# Patient Record
Sex: Female | Born: 1995 | Race: White | Hispanic: No | Marital: Single | Smoking: Never smoker
Health system: Southern US, Community
[De-identification: ages and names within clinical notes are randomized; demographics above are authoritative.]

## PROBLEM LIST (undated history)

## (undated) ENCOUNTER — Emergency Department: Admission: EM | Payer: BC Managed Care – PPO | Source: Home / Self Care

## (undated) DIAGNOSIS — F410 Panic disorder [episodic paroxysmal anxiety] without agoraphobia: Secondary | ICD-10-CM

## (undated) DIAGNOSIS — F419 Anxiety disorder, unspecified: Secondary | ICD-10-CM

## (undated) HISTORY — DX: Anxiety disorder, unspecified: F41.9

## (undated) HISTORY — PX: NO PAST SURGERIES: SHX2092

---
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
## 2017-04-22 ENCOUNTER — Other Ambulatory Visit: Payer: Self-pay

## 2017-04-22 ENCOUNTER — Ambulatory Visit
Admission: EM | Admit: 2017-04-22 | Discharge: 2017-04-22 | Disposition: A | Discharge status: Home / Self Care | Payer: BC Managed Care – PPO | Attending: Family Medicine | Admitting: Family Medicine

## 2017-04-22 ENCOUNTER — Encounter: Payer: Self-pay | Admitting: *Deleted

## 2017-04-22 DIAGNOSIS — R51 Headache: Secondary | ICD-10-CM

## 2017-04-22 DIAGNOSIS — R519 Headache, unspecified: Secondary | ICD-10-CM

## 2017-04-22 DIAGNOSIS — R040 Epistaxis: Secondary | ICD-10-CM | POA: Diagnosis not present

## 2017-04-22 LAB — CBC
HEMATOCRIT: 44.6 % (ref 35.0–47.0)
HEMOGLOBIN: 15.2 g/dL (ref 12.0–16.0)
MCH: 29.7 pg (ref 26.0–34.0)
MCHC: 34.1 g/dL (ref 32.0–36.0)
MCV: 87.2 fL (ref 80.0–100.0)
Platelets: 286 10*3/uL (ref 150–440)
RBC: 5.11 MIL/uL (ref 3.80–5.20)
RDW: 13.2 % (ref 11.5–14.5)
WBC: 9.5 10*3/uL (ref 3.6–11.0)

## 2017-04-22 NOTE — ED Triage Notes (Signed)
Patient has been having nose bleed ranging from mild to severe daily for the last 2 weeks. Patient does not report any sinu issues. Patient started having severe headache symptom this am.

## 2017-04-22 NOTE — Discharge Instructions (Signed)
Ibuprofen 800 mg three times daily as needed.  Vaseline and Saline.  Take care  Dr. Adriana Simasook

## 2017-04-22 NOTE — ED Provider Notes (Signed)
[facility]    CSN: [id number] Arrival date & time: 04/22/17  1631   History   Chief Complaint Chief Complaint  Patient presents with  . Epistaxis  . Headache   HPI  21 year old female presents with ongoing nosebleeds.  Patient reports that she has had frequent nosebleeds over the past 2 weeks.  She states it occurs once a day.  Last 5-10 minutes and then resolves with compression.  Her history of nosebleeds.  No history of allergies.  No nasal corticosteroid use.  She states she been using nasal saline regularly.  Additionally, she states that she has had headache this a.m.  Located diffusely from the frontal region to the septal region.  Has used Tylenol without resolution.  Pain is currently 8/10 in severity.  No reports of photophobia.  No other associated symptoms.  No known exacerbating or relieving factors.  No other complaints or concerns at this time.  PMH - None.  Surgical Hx - No past surgeries.  OB History    No data available     Home Medications    Prior to Admission medications   Not on File   Family History No reported family hx.  Social History Social History   Tobacco Use  . Smoking status: Never Smoker  . Smokeless tobacco: Never Used  Substance Use Topics  . Alcohol use: No    Frequency: Never  . Drug use: No   Allergies   Patient has no known allergies.   Review of Systems Review of Systems  HENT: Positive for nosebleeds.   Neurological: Positive for headaches.  All other systems reviewed and are negative.  Physical Exam Triage Vital Signs ED Triage Vitals  Enc Vitals Group     BP 04/22/17 1738 123/77     Pulse Rate 04/22/17 1738 83     Resp 04/22/17 1738 16     Temp 04/22/17 1738 98.1 F (36.7 C)     Temp Source 04/22/17 1738 Oral     SpO2 04/22/17 1738 99 %     Weight 04/22/17 1739 140 lb (63.5 kg)     Height 04/22/17 1739 5\' 4"  (1.626 m)     Head Circumference --      Peak Flow --      Pain Score 04/22/17  1739 8     Pain Loc --      Pain Edu? --      Excl. in GC? --    No data found.  Updated Vital Signs BP 123/77 (BP Location: Left Arm)   Pulse 83   Temp 98.1 F (36.7 C) (Oral)   Resp 16   Ht 5\' 4"  (1.626 m)   Wt 140 lb (63.5 kg)   LMP 04/08/2017   SpO2 99%   BMI 24.03 kg/m     Physical Exam  Constitutional: She is oriented to person, place, and time. She appears well-developed. No distress.  HENT:  Head: Normocephalic and atraumatic.  Nose: Mucosal edema present. No epistaxis.  Eyes: Conjunctivae are normal. No scleral icterus.  Cardiovascular: Normal rate and regular rhythm.  No murmur heard. Pulmonary/Chest: Effort normal and breath sounds normal. She has no wheezes. She has no rales.  Neurological: She is alert and oriented to person, place, and time.  No apparent focal deficits.  Skin: Skin is warm. No rash noted.  Psychiatric: She has a normal mood and affect. Her behavior is normal.  Nursing note and vitals reviewed.  UC Treatments / Results  Labs (all labs ordered are listed, but only abnormal results are displayed) Labs Reviewed  CBC    EKG  EKG Interpretation None       Radiology No results found.  Procedures Procedures (including critical care time)  Medications Ordered in UC Medications - No data to display   Initial Impression / Assessment and Plan / UC Course  I have reviewed the triage vital signs and the nursing notes.  Pertinent labs & imaging results that were available during my care of the patient were reviewed by me and considered in my medical decision making (see chart for details).     21 year old female presents with ongoing nosebleeds.  Also has headache.  CBC normal.  Advised continued use of saline and Vaseline.  Ibuprofen as needed for headache.  Final Clinical Impressions(s) / UC Diagnoses   Final diagnoses:  Anterior epistaxis  Acute nonintractable headache, unspecified headache type    ED Discharge Orders     None     Controlled Substance Prescriptions [place] Controlled Substance Registry consulted? Not Applicable   Tommie SamsCook, Jeryl Umholtz G, DO 04/22/17 [id number]

## 2017-09-22 ENCOUNTER — Ambulatory Visit
Admission: EM | Admit: 2017-09-22 | Discharge: 2017-09-22 | Disposition: A | Discharge status: Home / Self Care | Payer: BC Managed Care – PPO | Attending: Family Medicine | Admitting: Family Medicine

## 2017-09-22 DIAGNOSIS — J302 Other seasonal allergic rhinitis: Secondary | ICD-10-CM

## 2017-09-22 DIAGNOSIS — R062 Wheezing: Secondary | ICD-10-CM

## 2017-09-22 DIAGNOSIS — R0981 Nasal congestion: Secondary | ICD-10-CM

## 2017-09-22 MED ORDER — ALBUTEROL SULFATE HFA 108 (90 BASE) MCG/ACT IN AERS: 1.0000 | INHALATION_SPRAY | Freq: Four times a day (QID) | RESPIRATORY_TRACT | 0 refills | Status: DC | PRN

## 2017-09-22 MED ORDER — FLUTICASONE PROPIONATE 50 MCG/ACT NA SUSP: 1.0000 | Freq: Two times a day (BID) | NASAL | 0 refills | Status: DC | PRN

## 2017-09-22 MED ORDER — LORATADINE-PSEUDOEPHEDRINE ER 5-120 MG PO TB12: 1.0000 | ORAL_TABLET | Freq: Two times a day (BID) | ORAL | 0 refills | Status: DC

## 2017-09-22 NOTE — Discharge Instructions (Addendum)
Please take medications as prescribed.  You may continue with over-the-counter Robitussin.  Make sure you are drinking lots of fluids.  If any fevers, increasing wheezing or shortness of breath please return to clinic.

## 2017-09-22 NOTE — ED Provider Notes (Signed)
[facility]    CSN: [id number] Arrival date & time: 09/22/17  1050     History   Chief Complaint Chief Complaint  Patient presents with  . URI    HPI Sydney Banks is a 22 y.o. female.   Presents to the urgent care facility for evaluation of loss of voice, nasal congestion, wheezing.  Symptoms have present for 2 days.  She has been taking some Robitussin and Tylenol with minimal relief.  Patient states her cough is slightly improved but she still has a lot of nasal congestion and she cannot breathe through her nose.  She denies any fevers, sore throat, difficulty swallowing.  HPI  History reviewed. No pertinent past medical history.  There are no active problems to display for this patient.   History reviewed. No pertinent surgical history.  OB History   None      Home Medications    Prior to Admission medications   Medication Sig Start Date End Date Taking? Authorizing Provider  albuterol (PROVENTIL HFA;VENTOLIN HFA) 108 (90 Base) MCG/ACT inhaler Inhale 1-2 puffs into the lungs every 6 (six) hours as needed for wheezing or shortness of breath. 09/22/17   Evon Slack, PA-C  fluticasone (FLONASE) 50 MCG/ACT nasal spray Place 1-2 sprays into both nostrils 2 (two) times daily as needed for allergies or rhinitis. 09/22/17   Evon Slack, PA-C  loratadine-pseudoephedrine (CLARITIN-D 12 HOUR) 5-120 MG tablet Take 1 tablet by mouth 2 (two) times daily. 09/22/17   Evon Slack, PA-C    Family History No family history on file.  Social History Social History   Tobacco Use  . Smoking status: Never Smoker  . Smokeless tobacco: Never Used  Substance Use Topics  . Alcohol use: No    Frequency: Never  . Drug use: No     Allergies   Patient has no known allergies.   Review of Systems Review of Systems  Constitutional: Negative for fever.  HENT: Positive for congestion, rhinorrhea and voice change. Negative for ear discharge, sinus pressure,  sinus pain, sore throat and trouble swallowing.   Respiratory: Positive for cough and wheezing. Negative for shortness of breath and stridor.   Cardiovascular: Negative for chest pain.  Gastrointestinal: Negative for abdominal pain, diarrhea, nausea and vomiting.  Genitourinary: Negative for dysuria, flank pain and pelvic pain.  Musculoskeletal: Negative for back pain and myalgias.  Skin: Negative for rash.  Neurological: Negative for dizziness and headaches.     Physical Exam Triage Vital Signs ED Triage Vitals  Enc Vitals Group     BP 09/22/17 1059 122/82     Pulse Rate 09/22/17 1059 88     Resp 09/22/17 1059 18     Temp 09/22/17 1059 97.8 F (36.6 C)     Temp Source 09/22/17 1059 Oral     SpO2 09/22/17 1059 100 %     Weight 09/22/17 1101 140 lb (63.5 kg)     Height --      Head Circumference --      Peak Flow --      Pain Score 09/22/17 1100 4     Pain Loc --      Pain Edu? --      Excl. in GC? --    No data found.  Updated Vital Signs BP 122/82 (BP Location: Right Arm)   Pulse 88   Temp 97.8 F (36.6 C) (Oral)   Resp 18   Wt 140 lb (63.5 kg)  LMP 09/08/2017 (Exact Date)   SpO2 100%   BMI 24.03 kg/m   Visual Acuity Right Eye Distance:   Left Eye Distance:   Bilateral Distance:    Right Eye Near:   Left Eye Near:    Bilateral Near:     Physical Exam  Constitutional: She is oriented to person, place, and time. She appears well-developed and well-nourished.  HENT:  Head: Normocephalic and atraumatic.  Right Ear: External ear normal.  Left Ear: External ear normal.  Mouth/Throat: No oropharyngeal exudate.  Pharynx is normal no pharyngeal erythema or exudates.  Uvula is midline.  No sinus tenderness palpation.  Eyes: Conjunctivae are normal.  Neck: Normal range of motion.  Cardiovascular: Normal rate.  Pulmonary/Chest: Effort normal. No respiratory distress. She has wheezes (Mild).  Musculoskeletal: Normal range of motion.  Lymphadenopathy:    She  has no cervical adenopathy.  Neurological: She is alert and oriented to person, place, and time.  Skin: Skin is warm. No rash noted.  Psychiatric: She has a normal mood and affect. Her behavior is normal. Thought content normal.     UC Treatments / Results  Labs (all labs ordered are listed, but only abnormal results are displayed) Labs Reviewed - No data to display  EKG None  Radiology No results found.  Procedures Procedures (including critical care time)  Medications Ordered in UC Medications - No data to display  Initial Impression / Assessment and Plan / UC Course  I have reviewed the triage vital signs and the nursing notes.  Pertinent labs & imaging results that were available during my care of the patient were reviewed by me and considered in my medical decision making (see chart for details).     22 year old female with mild wheezing.  Vital signs are stable.  She is also had nasal congestion with hard time breathing through her nose.  She is started on antihistamine with decongestant.  She will use Flonase as prescribed.  She is also given prescription for albuterol.  She is educated on signs symptoms return to clinic for. Final Clinical Impressions(s) / UC Diagnoses   Final diagnoses:  Wheezing  Seasonal allergies  Nasal congestion     Discharge Instructions     Please take medications as prescribed.  You may continue with over-the-counter Robitussin.  Make sure you are drinking lots of fluids.  If any fevers, increasing wheezing or shortness of breath please return to clinic.   ED Prescriptions    Medication Sig Dispense Auth. Provider   albuterol (PROVENTIL HFA;VENTOLIN HFA) 108 (90 Base) MCG/ACT inhaler Inhale 1-2 puffs into the lungs every 6 (six) hours as needed for wheezing or shortness of breath. 1 Inhaler Evon Slack, PA-C   loratadine-pseudoephedrine (CLARITIN-D 12 HOUR) 5-120 MG tablet Take 1 tablet by mouth 2 (two) times daily. 20 tablet  Evon Slack, PA-C   fluticasone (FLONASE) 50 MCG/ACT nasal spray Place 1-2 sprays into both nostrils 2 (two) times daily as needed for allergies or rhinitis. 16 g Ronnette Juniper       Evon Slack, [place] 09/22/17 1132

## 2017-09-22 NOTE — ED Triage Notes (Signed)
Pt thinks she has an URI. Loss of voice, chest tightness, nasal congestion and sore throat from coughing since Thursday. Did take otc meds without relief.

## 2017-09-23 ENCOUNTER — Encounter: Payer: Self-pay | Admitting: Emergency Medicine

## 2017-09-23 ENCOUNTER — Emergency Department
Admission: EM | Admit: 2017-09-23 | Discharge: 2017-09-23 | Disposition: A | Discharge status: Home / Self Care | Payer: BC Managed Care – PPO | Attending: Emergency Medicine | Admitting: Emergency Medicine

## 2017-09-23 ENCOUNTER — Emergency Department: Payer: BC Managed Care – PPO

## 2017-09-23 ENCOUNTER — Other Ambulatory Visit: Payer: Self-pay

## 2017-09-23 DIAGNOSIS — R091 Pleurisy: Secondary | ICD-10-CM | POA: Insufficient documentation

## 2017-09-23 DIAGNOSIS — R05 Cough: Secondary | ICD-10-CM | POA: Diagnosis present

## 2017-09-23 MED ORDER — BENZONATATE 100 MG PO CAPS
200.0000 mg | ORAL_CAPSULE | Freq: Once | ORAL | Status: AC
  Administered 2017-09-23: 200 mg via ORAL
  Filled 2017-09-23: qty 2

## 2017-09-23 MED ORDER — BENZONATATE 100 MG PO CAPS: 100.0000 mg | ORAL_CAPSULE | Freq: Three times a day (TID) | ORAL | 0 refills | Status: AC | PRN

## 2017-09-23 MED ORDER — PREDNISONE 50 MG PO TABS: ORAL_TABLET | ORAL | 0 refills | Status: DC

## 2017-09-23 NOTE — ED Triage Notes (Signed)
Patient ambulatory to triage with steady gait, without difficulty or distress noted; st rx albuterol, flonase and claritin yesterday for URI; c/o persistent nonprod cough and congestion

## 2017-09-23 NOTE — ED Provider Notes (Signed)
[facility] Emergency Department Provider Note  ____________________________________________  Time seen: Approximately 10:11 PM  I have reviewed the triage vital signs and the nursing notes.   HISTORY  Chief Complaint Cough and Nasal Congestion    HPI Sydney Banks is a 22 y.o. female presents to the emergency department with worsening nonproductive cough and a sense of being unable to take a deep breath.  Patient reports that she feels short of breath due to not being able to take a deep breath.  Patient reports pain with deep inspiration.  She denies fever or purulent sputum production.  No fatigue.  Patient was previously seen by med in urgent care and discharged with albuterol and Flonase.  Patient reports that her symptoms are not improving.   History reviewed. No pertinent past medical history.  There are no active problems to display for this patient.   History reviewed. No pertinent surgical history.  Prior to Admission medications   Medication Sig Start Date End Date Taking? Authorizing Provider  albuterol (PROVENTIL HFA;VENTOLIN HFA) 108 (90 Base) MCG/ACT inhaler Inhale 1-2 puffs into the lungs every 6 (six) hours as needed for wheezing or shortness of breath. 09/22/17   Evon Slack, PA-C  benzonatate (TESSALON PERLES) 100 MG capsule Take 1 capsule (100 mg total) by mouth 3 (three) times daily as needed for up to 7 days for cough. 09/23/17 09/30/17  Orvil Feil, PA-C  fluticasone (FLONASE) 50 MCG/ACT nasal spray Place 1-2 sprays into both nostrils 2 (two) times daily as needed for allergies or rhinitis. 09/22/17   Evon Slack, PA-C  loratadine-pseudoephedrine (CLARITIN-D 12 HOUR) 5-120 MG tablet Take 1 tablet by mouth 2 (two) times daily. 09/22/17   Evon Slack, PA-C  predniSONE (DELTASONE) 50 MG tablet Take one 50 mg tablet once daily for the next 5 days. 09/23/17   Orvil Feil, PA-C    Allergies Patient has no known  allergies.  No family history on file.  Social History Social History   Tobacco Use  . Smoking status: Never Smoker  . Smokeless tobacco: Never Used  Substance Use Topics  . Alcohol use: No    Frequency: Never  . Drug use: No     Review of Systems  Constitutional: No fever/chills Eyes: No visual changes. No discharge ENT: Patient has congestion. Cardiovascular: no chest pain. Respiratory: Patient has pleuritic chest pain. Gastrointestinal: No abdominal pain.  No nausea, no vomiting.  No diarrhea.  No constipation. Musculoskeletal: Negative for musculoskeletal pain. Skin: Negative for rash, abrasions, lacerations, ecchymosis. Neurological: Negative for headaches, focal weakness or numbness.   ____________________________________________   PHYSICAL EXAM:  VITAL SIGNS: ED Triage Vitals  Enc Vitals Group     BP 09/23/17 2056 130/80     Pulse Rate 09/23/17 2056 94     Resp 09/23/17 2056 18     Temp 09/23/17 2056 98.3 F (36.8 C)     Temp Source 09/23/17 2056 Oral     SpO2 09/23/17 2056 99 %     Weight 09/23/17 2058 140 lb (63.5 kg)     Height 09/23/17 2058  (1.626 m)     Head Circumference --      Peak Flow --      Pain Score 09/23/17 2058 0     Pain Loc --      Pain Edu? --      Excl. in GC? --      Constitutional: Alert and oriented. Well appearing  and in no acute distress. Eyes: Conjunctivae are normal. PERRL. EOMI. Head: Atraumatic. ENT:      Ears: TMs are effused bilaterally.      Nose: No congestion/rhinnorhea.      Mouth/Throat: Mucous membranes are moist.  Neck: No stridor.  No cervical spine tenderness to palpation. Cardiovascular: Normal rate, regular rhythm. Normal S1 and S2.  Good peripheral circulation. Respiratory: Normal respiratory effort without tachypnea or retractions. Lungs CTAB. Good air entry to the bases with no decreased or absent breath sounds. Gastrointestinal: Bowel sounds 4 quadrants. Soft and nontender to palpation. No  guarding or rigidity. No palpable masses. No distention. No CVA tenderness. Musculoskeletal: Full range of motion to all extremities. No gross deformities appreciated. Neurologic:  Normal speech and language. No gross focal neurologic deficits are appreciated.  Skin:  Skin is warm, dry and intact. No rash noted.   ____________________________________________   LABS (all labs ordered are listed, but only abnormal results are displayed)  Labs Reviewed - No data to display ____________________________________________  EKG   ____________________________________________  RADIOLOGY Geraldo Pitter, personally viewed and evaluated these images (plain radiographs) as part of my medical decision making, as well as reviewing the written report by the radiologist.    Dg Chest 2 View  Result Date: 09/23/2017 CLINICAL DATA:  Initial evaluation for nonproductive cough for 1 week. EXAM: CHEST - 2 VIEW COMPARISON:  None. FINDINGS: The cardiac and mediastinal silhouettes are within normal limits. The lungs are normally inflated. No airspace consolidation, pleural effusion, or pulmonary edema is identified. There is no pneumothorax. No acute osseous abnormality identified. IMPRESSION: No active cardiopulmonary disease. Electronically Signed   By: Rise Mu M.D.   On: 09/23/2017 22:31    ____________________________________________    PROCEDURES  Procedure(s) performed:    Procedures    Medications  benzonatate (TESSALON) capsule 200 mg (200 mg Oral Given 09/23/17 2251)     ____________________________________________   INITIAL IMPRESSION / ASSESSMENT AND PLAN / ED COURSE  Pertinent labs & imaging results that were available during my care of the patient were reviewed by me and considered in my medical decision making (see chart for details).  Review of the [place] CSRS was performed in accordance of the [facility] prior to dispensing any controlled drugs.    Assessment and  plan Viral URI Pleuritis Patient presents to the emergency department with pain with deep inspiration.  Differential diagnosis included pleuritis, community-acquired pneumonia and viral URI.  Chest x-ray revealed no consolidations or findings consistent with pneumonia.  Pleuritis is likely at this time.  Patient was treated empirically with prednisone and Tessalon Perles.  A work note was provided.  Vital signs are reassuring prior to discharge.  All patient questions were answered.   ____________________________________________  FINAL CLINICAL IMPRESSION(S) / ED DIAGNOSES  Final diagnoses:  Pleuritis      NEW MEDICATIONS STARTED DURING THIS VISIT:  ED Discharge Orders        Ordered    predniSONE (DELTASONE) 50 MG tablet     09/23/17 2243    benzonatate (TESSALON PERLES) 100 MG capsule  3 times daily PRN     09/23/17 2244          This chart was dictated using voice recognition software/Dragon. Despite best efforts to proofread, errors can occur which can change the meaning. Any change was purely unintentional.    Orvil Feil, PA-C 09/23/17 2258    Minna Antis, MD 09/23/17 [phone]

## 2017-09-23 NOTE — ED Notes (Signed)
Patient denies pain and is resting comfortably.  

## 2018-02-06 ENCOUNTER — Emergency Department
Admission: EM | Admit: 2018-02-06 | Discharge: 2018-02-07 | Disposition: A | Discharge status: Home / Self Care | Payer: BC Managed Care – PPO | Attending: Emergency Medicine | Admitting: Emergency Medicine

## 2018-02-06 ENCOUNTER — Other Ambulatory Visit: Payer: Self-pay

## 2018-02-06 DIAGNOSIS — T7411XA Adult physical abuse, confirmed, initial encounter: Secondary | ICD-10-CM

## 2018-02-06 DIAGNOSIS — Z91411 Personal history of adult psychological abuse: Secondary | ICD-10-CM | POA: Insufficient documentation

## 2018-02-06 DIAGNOSIS — Z0471 Encounter for examination and observation following alleged adult physical abuse: Secondary | ICD-10-CM | POA: Insufficient documentation

## 2018-02-06 DIAGNOSIS — T7431XA Adult psychological abuse, confirmed, initial encounter: Secondary | ICD-10-CM

## 2018-02-06 DIAGNOSIS — F410 Panic disorder [episodic paroxysmal anxiety] without agoraphobia: Secondary | ICD-10-CM

## 2018-02-06 DIAGNOSIS — F419 Anxiety disorder, unspecified: Secondary | ICD-10-CM | POA: Insufficient documentation

## 2018-02-06 DIAGNOSIS — Z0441 Encounter for examination and observation following alleged adult rape: Secondary | ICD-10-CM | POA: Insufficient documentation

## 2018-02-06 DIAGNOSIS — T7421XA Adult sexual abuse, confirmed, initial encounter: Secondary | ICD-10-CM

## 2018-02-06 MED ORDER — LORAZEPAM 1 MG PO TABS
1.0000 mg | ORAL_TABLET | Freq: Once | ORAL | Status: AC
  Administered 2018-02-06: 1 mg via ORAL
  Filled 2018-02-06: qty 1
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
## 2018-02-06 NOTE — ED Notes (Signed)
[facility] personnel at bedside 
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
## 2018-02-07 MED ORDER — LORAZEPAM 1 MG PO TABS
1.0000 mg | ORAL_TABLET | Freq: Three times a day (TID) | ORAL | 0 refills | Status: AC | PRN
Start: 2018-02-07 — End: 2019-02-07
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
## 2018-02-07 NOTE — ED Notes (Signed)
Sane RN left patient's room. Patient up to toilet.

## 2018-02-07 NOTE — ED Notes (Signed)
Reviewed discharge instructions, follow-up care, and prescriptions with patient. Patient verbalized understanding of all information reviewed. Patient stable, with no distress noted at this time.    

## 2018-02-07 NOTE — ED Notes (Signed)
ED Provider at bedside. 
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
## 2018-06-20 ENCOUNTER — Encounter: Payer: Self-pay | Admitting: Emergency Medicine

## 2018-06-20 ENCOUNTER — Emergency Department
Admission: EM | Admit: 2018-06-20 | Discharge: 2018-06-20 | Disposition: A | Discharge status: Home / Self Care | Payer: BC Managed Care – PPO | Attending: Emergency Medicine | Admitting: Emergency Medicine

## 2018-06-20 ENCOUNTER — Emergency Department: Payer: BC Managed Care – PPO

## 2018-06-20 ENCOUNTER — Other Ambulatory Visit: Payer: Self-pay

## 2018-06-20 DIAGNOSIS — Z79899 Other long term (current) drug therapy: Secondary | ICD-10-CM | POA: Diagnosis not present

## 2018-06-20 DIAGNOSIS — K6289 Other specified diseases of anus and rectum: Secondary | ICD-10-CM | POA: Insufficient documentation

## 2018-06-20 DIAGNOSIS — Z139 Encounter for screening, unspecified: Secondary | ICD-10-CM | POA: Diagnosis not present

## 2018-06-20 HISTORY — DX: Panic disorder (episodic paroxysmal anxiety): F41.0

## 2018-06-20 LAB — URINALYSIS, ROUTINE W REFLEX MICROSCOPIC
Bilirubin Urine: NEGATIVE
Glucose, UA: NEGATIVE mg/dL
HGB URINE DIPSTICK: NEGATIVE
KETONES UR: 20 mg/dL — AB
LEUKOCYTE UA: NEGATIVE
Nitrite: NEGATIVE
PROTEIN: NEGATIVE mg/dL
Specific Gravity, Urine: 1.02 (ref 1.005–1.030)
pH: 6 (ref 5.0–8.0)

## 2018-06-20 LAB — PREGNANCY, URINE: PREG TEST UR: NEGATIVE

## 2018-06-20 NOTE — Discharge Instructions (Signed)
Follow-up closely with [facility] department.  Additionally, please follow-up closely with 1 of our OB/GYN providers.   Please return to the emergency room right away if you are to develop a fever, severe nausea, your pain becomes severe or worsens, you are unable to keep food down, begin vomiting any dark or bloody fluid, you develop any dark or bloody stools, feel dehydrated, or other new concerns or symptoms arise.
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
## 2018-06-20 NOTE — ED Notes (Signed)
RN in room, RN explained to pt that MD was getting ready to discharge her but he wanted to know if she would be agreeable to either MD or RN doing an abdominal exam to ensure that she does not have tenderness. Pt was agreeable to this and requested that RN do the exam.  This RN palpated all quadrants of pts abdomen. No abdominal tenderness noted on palpation. Pt denies painful urination and states that she is still able to urinate. Pt states that she has to urinate at this time. RN asked pt if she would be agreeable to giving [place] a sample. Pt asked "can I just go home". RN informed pt that she did not have to stay and wait on the results of the urine. Pt was agreeable to providing a specimen. Pt was instructed on how to perform a clean catch urine sample.
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
## 2018-06-20 NOTE — ED Notes (Signed)
RN in room to inform pt that SANE nurse is here to speak with her. Pt is still agreeable to speaking with SANE RN.  Pt Friend, Cassie at bedside

## 2018-06-20 NOTE — ED Notes (Signed)
Pt tolerated x-ray however was very anxious and had visible tremors during the exam. Pt denies pain at this time. Pt decline anything for anxiety at this time. Pt is asking that her friend, Cassie come back in the room.
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
## 2018-06-20 NOTE — ED Notes (Addendum)
SANE nurse out of room, pt requesting that her friend, Wilford Sports, come back in room.  SANE RN to speak with Dr. Fanny Bien

## 2018-06-20 NOTE — ED Notes (Signed)
RN in room, explained to the pt that x-ray is here to do her abdominal x-ray. Pt asked if her friend, Wilford Sports could be present during the x-ray. This RN informed pt that Cassie could not stay in the room during the exam but that I would put on lead apron and stay in the room with her during the exam if this would make her feel more comfortable. Pt was agreeable to this and x-rays techs were informed of plan.

## 2018-06-20 NOTE — ED Notes (Signed)
RN in room to check on pt. Pt sitting on bed with her shoes on. Pt requesting that Cassie come back in the room. Informed her that I would find Cassie and have her come back in the room.

## 2018-06-20 NOTE — ED Notes (Signed)
SANE nurse Jasmine December in room speaking with pt
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
## 2019-04-30 ENCOUNTER — Ambulatory Visit: Payer: BC Managed Care – PPO | Attending: Internal Medicine

## 2019-04-30 DIAGNOSIS — Z20822 Contact with and (suspected) exposure to covid-19: Secondary | ICD-10-CM

## 2019-05-01 LAB — NOVEL CORONAVIRUS, NAA: SARS-CoV-2, NAA: NOT DETECTED

## 2019-05-11 ENCOUNTER — Ambulatory Visit: Payer: BC Managed Care – PPO | Attending: Internal Medicine

## 2019-05-11 DIAGNOSIS — Z20822 Contact with and (suspected) exposure to covid-19: Secondary | ICD-10-CM

## 2019-05-12 LAB — NOVEL CORONAVIRUS, NAA: SARS-CoV-2, NAA: NOT DETECTED

## 2019-05-19 ENCOUNTER — Telehealth: Payer: Self-pay

## 2019-05-19 NOTE — Telephone Encounter (Signed)
Pt called to say that she has missed four calls from [facility]. She took a Covid test and go the results from her Mychart. I explained that they results were negative and she is probably getting the automated call to inform her of the results. She is requesting to have them stopped. I gave her the MyChart help desk phone number for support.   Sydney Banks

## 2019-07-10 ENCOUNTER — Ambulatory Visit: Payer: BC Managed Care – PPO | Attending: Internal Medicine

## 2019-07-10 DIAGNOSIS — Z20822 Contact with and (suspected) exposure to covid-19: Secondary | ICD-10-CM

## 2019-07-11 LAB — NOVEL CORONAVIRUS, NAA: SARS-CoV-2, NAA: NOT DETECTED

## 2019-12-26 ENCOUNTER — Emergency Department
Admission: EM | Admit: 2019-12-26 | Discharge: 2019-12-26 | Discharge status: Against Medical Advice | Payer: BC Managed Care – PPO

## 2019-12-26 NOTE — ED Notes (Signed)
Pt left without being seen at 1132.

## 2020-01-06 ENCOUNTER — Other Ambulatory Visit: Payer: Self-pay

## 2020-02-28 ENCOUNTER — Other Ambulatory Visit: Payer: Self-pay

## 2020-02-28 ENCOUNTER — Encounter: Payer: Self-pay | Admitting: Emergency Medicine

## 2020-02-28 ENCOUNTER — Ambulatory Visit
Admission: EM | Admit: 2020-02-28 | Discharge: 2020-02-28 | Disposition: A | Discharge status: Home / Self Care | Payer: BC Managed Care – PPO | Attending: Emergency Medicine | Admitting: Emergency Medicine

## 2020-02-28 DIAGNOSIS — H60501 Unspecified acute noninfective otitis externa, right ear: Secondary | ICD-10-CM

## 2020-02-28 MED ORDER — NEOMYCIN-POLYMYXIN-HC 3.5-10000-1 OT SUSP: 4.0000 [drp] | Freq: Three times a day (TID) | OTIC | 0 refills | Status: DC

## 2020-02-28 NOTE — ED Triage Notes (Signed)
Patient c/o bilateral ear pain and fullness that started yesterday.  Patient denies fevers.

## 2020-02-28 NOTE — ED Provider Notes (Signed)
[facility]    CSN: [id number] Arrival date & time: 02/28/20  1325      History   Chief Complaint Chief Complaint  Patient presents with  . Otalgia    HPI Sydney Banks is a 24 y.o. female.   24 year old female presents for evaluation of bilateral ear pain right worse than left.  Patient reports that her pain started last night.  She says it felt like when she is getting swimmer's ear in the past, use some over-the-counter swimmer's ear drops and no relief.  She denies discharge, fever, changes in hearing, runny nose, or sore throat.  She has had a ringing in her right ear intermittently.  She denies any recent swimming in pools, lakes, or hot tubs.  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  She denies wearing ear buds on a regular basis.     Past Medical History:  Diagnosis Date  . Panic attacks     There are no problems to display for this patient.   History reviewed. No pertinent surgical history.  OB History   No obstetric history on file.      Home Medications    Prior to Admission medications   Medication Sig Start Date End Date Taking? Authorizing Provider  neomycin-polymyxin-hydrocortisone (CORTISPORIN) 3.5-10000-1 OTIC suspension Place 4 drops into the right ear 3 (three) times daily. 02/28/20   Becky Augusta, NP  albuterol (PROVENTIL HFA;VENTOLIN HFA) 108 (90 Base) MCG/ACT inhaler Inhale 1-2 puffs into the lungs every 6 (six) hours as needed for wheezing or shortness of breath. 09/22/17 02/28/20  Evon Slack, PA-C  fluticasone (FLONASE) 50 MCG/ACT nasal spray Place 1-2 sprays into both nostrils 2 (two) times daily as needed for allergies or rhinitis. 09/22/17 02/28/20  Evon Slack, PA-C    Family History History reviewed. No pertinent family history.  Social History Social History   Tobacco Use  . Smoking status: Never Smoker  . Smokeless tobacco: Never Used  Vaping Use  . Vaping Use: Never used  Substance Use Topics  .  Alcohol use: No  . Drug use: No     Allergies   Patient has no known allergies.   Review of Systems Review of Systems  Constitutional: Negative for activity change, appetite change and fever.  HENT: Positive for ear pain. Negative for congestion, ear discharge, rhinorrhea and sore throat.   Respiratory: Negative for cough and shortness of breath.   Cardiovascular: Negative for chest pain.  Gastrointestinal: Negative for nausea.  Musculoskeletal: Negative for arthralgias and myalgias.  Skin: Negative for rash.  Neurological: Negative for dizziness and headaches.  Hematological: Negative.   Psychiatric/Behavioral: Negative.      Physical Exam Triage Vital Signs ED Triage Vitals  Enc Vitals Group     BP 02/28/20 1340 121/74     Pulse Rate 02/28/20 1340 71     Resp 02/28/20 1340 14     Temp 02/28/20 1340 98 F (36.7 C)     Temp Source 02/28/20 1340 Oral     SpO2 02/28/20 1340 100 %     Weight 02/28/20 1337 140 lb (63.5 kg)     Height 02/28/20 1337 5\' 4"  (1.626 m)     Head Circumference --      Peak Flow --      Pain Score 02/28/20 1336 7     Pain Loc --      Pain Edu? --      Excl. in GC? --  No data found.  Updated Vital Signs BP 121/74 (BP Location: Right Arm)   Pulse 71   Temp 98 F (36.7 C) (Oral)   Resp 14   Ht 5\' 4"  (1.626 m)   Wt 140 lb (63.5 kg)   LMP 02/14/2020 (Approximate)   SpO2 100%   BMI 24.03 kg/m   Visual Acuity Right Eye Distance:   Left Eye Distance:   Bilateral Distance:    Right Eye Near:   Left Eye Near:    Bilateral Near:     Physical Exam Vitals and nursing note reviewed.  Constitutional:      General: She is not in acute distress.    Appearance: Normal appearance. She is normal weight. She is not toxic-appearing.  HENT:     Head: Normocephalic and atraumatic.     Right Ear: Tympanic membrane and external ear normal. There is no impacted cerumen.     Left Ear: Tympanic membrane, ear canal and external ear normal. There  is no impacted cerumen.     Ears:     Comments: There is mild redness and swelling of the right EAC.  There is pain with movement of the auricle but no tragal tenderness.  Left ear and TM are completely normal. Eyes:     General: No scleral icterus.    Extraocular Movements: Extraocular movements intact.     Conjunctiva/sclera: Conjunctivae normal.     Pupils: Pupils are equal, round, and reactive to light.  Cardiovascular:     Rate and Rhythm: Normal rate and regular rhythm.     Pulses: Normal pulses.     Heart sounds: Normal heart sounds. No murmur heard.  No gallop.   Pulmonary:     Effort: Pulmonary effort is normal.     Breath sounds: Normal breath sounds. No wheezing, rhonchi or rales.  Musculoskeletal:        General: No swelling or tenderness. Normal range of motion.     Cervical back: Normal range of motion and neck supple.  Lymphadenopathy:     Cervical: No cervical adenopathy.  Skin:    General: Skin is warm and dry.     Capillary Refill: Capillary refill takes less than 2 seconds.     Coloration: Skin is not jaundiced.     Findings: No erythema.  Neurological:     General: No focal deficit present.     Mental Status: She is alert and oriented to person, place, and time.  Psychiatric:        Mood and Affect: Mood normal.        Behavior: Behavior normal.        Thought Content: Thought content normal.      UC Treatments / Results  Labs (all labs ordered are listed, but only abnormal results are displayed) Labs Reviewed - No data to display  EKG   Radiology No results found.  Procedures Procedures (including critical care time)  Medications Ordered in UC Medications - No data to display  Initial Impression / Assessment and Plan / UC Course  I have reviewed the triage vital signs and the nursing notes.  Pertinent labs & imaging results that were available during my care of the patient were reviewed by me and considered in my medical decision making  (see chart for details).   Patient has had bilateral ear pain right greater than left x1 day.  No discharge, fever, or URI complaints.  Patient has a history of swimmer's ear.  She denies any  recent swimming in lakes, pools, or being in hot tubs.  On exam the right EAC is mildly erythematous and edematous.  Will treat with Cortisporin otic and have patient follow-up with PCP if not better.   Final Clinical Impressions(s) / UC Diagnoses   Final diagnoses:  Acute otitis externa of right ear, unspecified type     Discharge Instructions     Use the Cortisporin otic 3 times a day for the next 5 days.  You can take over-the-counter ibuprofen as needed for pain.  Sometimes a heating pad or hot water bottle underneath her pillow case at nighttime can provide comfort as well.  If you develop any increase in pain, drainage from your ear, or other concerning symptoms return for reevaluation or follow-up with your primary care provider.    ED Prescriptions    Medication Sig Dispense Auth. Provider   neomycin-polymyxin-hydrocortisone (CORTISPORIN) 3.5-10000-1 OTIC suspension Place 4 drops into the right ear 3 (three) times daily. 10 mL Becky Augusta, NP     PDMP not reviewed this encounter.   Becky Augusta, NP 02/28/20 [phone]

## 2020-02-28 NOTE — Discharge Instructions (Addendum)
Use the Cortisporin otic 3 times a day for the next 5 days.  You can take over-the-counter ibuprofen as needed for pain.  Sometimes a heating pad or hot water bottle underneath her pillow case at nighttime can provide comfort as well.  If you develop any increase in pain, drainage from your ear, or other concerning symptoms return for reevaluation or follow-up with your primary care provider.

## 2020-04-02 ENCOUNTER — Other Ambulatory Visit: Payer: Self-pay

## 2020-04-02 ENCOUNTER — Ambulatory Visit
Admission: EM | Admit: 2020-04-02 | Discharge: 2020-04-02 | Disposition: A | Discharge status: Home / Self Care | Payer: BC Managed Care – PPO | Attending: Emergency Medicine | Admitting: Emergency Medicine

## 2020-04-02 ENCOUNTER — Encounter: Payer: Self-pay | Admitting: Emergency Medicine

## 2020-04-02 DIAGNOSIS — A048 Other specified bacterial intestinal infections: Secondary | ICD-10-CM | POA: Diagnosis present

## 2020-04-02 DIAGNOSIS — R1013 Epigastric pain: Secondary | ICD-10-CM | POA: Insufficient documentation

## 2020-04-02 LAB — BASIC METABOLIC PANEL
Anion gap: 11 (ref 5–15)
BUN: 10 mg/dL (ref 6–20)
CO2: 26 mmol/L (ref 22–32)
Calcium: 9.6 mg/dL (ref 8.9–10.3)
Chloride: 103 mmol/L (ref 98–111)
Creatinine, Ser: 0.69 mg/dL (ref 0.44–1.00)
GFR, Estimated: >60 mL/min (ref >60)
Glucose, Bld: 93 mg/dL (ref 70–99)
Potassium: 4.2 mmol/L (ref 3.5–5.1)
Sodium: 140 mmol/L (ref 135–145)

## 2020-04-02 MED ORDER — PANTOPRAZOLE SODIUM 40 MG PO TBEC: 40.0000 mg | DELAYED_RELEASE_TABLET | Freq: Two times a day (BID) | ORAL | 0 refills | Status: DC

## 2020-04-02 NOTE — ED Provider Notes (Signed)
[facility] [place]   Name: Sydney Banks DOB: [birth year] MRN: [id number] CSN: [id number] PCP: Patient, No Pcp Per  Arrival date and time:  04/02/20 1317  Chief Complaint:  Abdominal Pain   NOTE: Prior to seeing the patient today, I have reviewed the triage nursing documentation and vital signs. Clinical staff has updated patient's PMH/PSHx, current medication list, and drug allergies/intolerances to ensure comprehensive history available to assist in medical decision making.   History:   HPI: Sydney Banks is a 24 y.o. female who presents today with complaints of digestive issues.  Patient states over the past week, every minute she has "goes right through her".  Her bowel movements over the past week have been loose.  She denies any pain or discomfort with these events, but she is unable to eat a full meal due to early satiety.  She did have nausea and her symptoms initially began approximately 6 days ago, but she took over-the-counter antinausea medication and her nausea subsided.  She is also noticed "fluttering" to her chest when she tries to eat.  These palpitations also occur when she is lying down.  The feeling tends to self resolve, however this morning it took approximately 1.5 hours for this feeling to resolve.  She denies any previous GI issues and is not taking any GI medications.  She denies any changes to her diet or any increased life stressors.   Past Medical History:  Diagnosis Date  . Panic attacks     History reviewed. No pertinent surgical history.  History reviewed. No pertinent family history.  Social History   Tobacco Use  . Smoking status: Never Smoker  . Smokeless tobacco: Never Used  Vaping Use  . Vaping Use: Never used  Substance Use Topics  . Alcohol use: No  . Drug use: No    There are no problems to display for this patient.   Home Medications:    No outpatient medications have been marked as taking for the 04/02/20  encounter [facility] Encounter).    Allergies:   Patient has no known allergies.  Review of Systems (ROS): Review of Systems  Constitutional: Negative for fatigue and fever.  Respiratory: Negative for cough and shortness of breath.   Cardiovascular: Positive for palpitations.  Gastrointestinal: Positive for diarrhea and nausea. Negative for abdominal distention, abdominal pain, anal bleeding, constipation and rectal pain.  Neurological: Negative for dizziness and headaches.  All other systems reviewed and are negative.    Vital Signs: Today's Vitals   04/02/20 1328 04/02/20 1330 04/02/20 1413  BP:  127/73   Pulse:  70   Resp:  14   Temp:  98.2 F (36.8 C)   TempSrc:  Oral   SpO2:  100%   Weight: 150 lb (68 kg)    Height: 5\' 4"  (1.626 m)    PainSc: 0-No pain  0-No pain    Physical Exam: Physical Exam Vitals and nursing note reviewed.  Constitutional:      Appearance: She is well-developed and normal weight.  HENT:     Mouth/Throat:     Mouth: Mucous membranes are moist.  Eyes:     Extraocular Movements: Extraocular movements intact.  Cardiovascular:     Rate and Rhythm: Normal rate and regular rhythm.     Heart sounds: Normal heart sounds.  Pulmonary:     Effort: Pulmonary effort is normal.     Breath sounds: Normal breath sounds.  Abdominal:  General: Abdomen is flat. Bowel sounds are normal.     Palpations: Abdomen is soft.     Tenderness: There is no abdominal tenderness.     Hernia: No hernia is present.  Skin:    General: Skin is warm and dry.     Capillary Refill: Capillary refill takes less than 2 seconds.  Neurological:     Mental Status: She is alert.      Urgent Care Treatments / Results:   LABS: PLEASE NOTE: all labs that were ordered this encounter are listed, however only abnormal results are displayed. Labs Reviewed  BASIC METABOLIC PANEL  H PYLORI, IGM, IGG, IGA AB    EKG: -None  RADIOLOGY: No results  found.  PROCEDURES: Procedures  MEDICATIONS RECEIVED THIS VISIT: Medications - No data to display  PERTINENT CLINICAL COURSE NOTES/UPDATES:   Initial Impression / Assessment and Plan / Urgent Care Course:  Pertinent labs & imaging results that were available during my care of the patient were personally reviewed by me and considered in my medical decision making (see lab/imaging section of note for values and interpretations).  Sydney Banks is a 24 y.o. female who presents to [facility] today with complaints of dyspepsia, diagnosed with dyspepsia and possible H. pylori, and treated as such with the medications below. NP and patient reviewed discharge instructions below during visit.   Patient is well appearing overall in clinic today. She does not appear to be in any acute distress. Presenting symptoms (see HPI) and exam as documented above.   I have reviewed the follow up and strict return precautions for any new or worsening symptoms. Patient is aware of symptoms that would be deemed urgent/emergent, and would thus require further evaluation either here or in the emergency department. At the time of discharge, she verbalized understanding and consent with the discharge plan as it was reviewed with her. All questions were fielded by provider and/or clinic staff prior to patient discharge.    Final Clinical Impressions / Urgent Care Diagnoses:   Final diagnoses:  Dyspepsia  H. pylori infection    New Prescriptions:  [place] Controlled Substance Registry consulted? Not Applicable  Meds ordered this encounter  Medications  . pantoprazole (PROTONIX) 40 MG tablet    Sig: Take 1 tablet (40 mg total) by mouth 2 (two) times daily.    Dispense:  21 tablet    Refill:  0      Discharge Instructions     You were seen for abnormal digestive patterns and are being treated for possible H. pylori infection.   Take your prescribed medications as directed.  I will give you a call with  your H. pylori results once they have completed.  In the meantime, start to search for a primary care provider.  Take care, Dr. Sharlet Salina, NP-c     Recommended Follow up Care:  Patient encouraged to follow up with the following provider within the specified time frame, or sooner as dictated by the severity of her symptoms. As always, she was instructed that for any urgent/emergent care needs, she should seek care either here or in the emergency department for more immediate evaluation.   Bailey Mech, DNP, NP-c    Bailey Mech, NP 04/02/20 1736

## 2020-04-02 NOTE — Discharge Instructions (Signed)
You were seen for abnormal digestive patterns and are being treated for possible H. pylori infection.   Take your prescribed medications as directed.  I will give you a call with your H. pylori results once they have completed.  In the meantime, start to search for a primary care provider.  Take care, Dr. Sharlet Salina, NP-c

## 2020-04-02 NOTE — ED Triage Notes (Signed)
Patient states that since Monday after she eats she started having stomach pain and loose stools.  Patient states when she started to eat she starts to feel full.  Patient denies fevers.  Patient denies N/V.

## 2020-04-04 LAB — H PYLORI, IGM, IGG, IGA AB
H Pylori IgG: 0.19 Index Value (ref 0.00–0.79)
H. Pylogi, Iga Abs: <9 units (ref 0.0–8.9)
H. Pylogi, Igm Abs: <9 units (ref 0.0–8.9)

## 2020-06-06 ENCOUNTER — Other Ambulatory Visit: Payer: Self-pay

## 2020-06-06 ENCOUNTER — Ambulatory Visit (INDEPENDENT_AMBULATORY_CARE_PROVIDER_SITE_OTHER): Payer: BC Managed Care – PPO

## 2020-06-06 ENCOUNTER — Encounter: Payer: Self-pay | Admitting: Emergency Medicine

## 2020-06-06 ENCOUNTER — Ambulatory Visit
Admission: EM | Admit: 2020-06-06 | Discharge: 2020-06-06 | Disposition: A | Discharge status: Home / Self Care | Payer: BC Managed Care – PPO | Attending: Physician Assistant | Admitting: Physician Assistant

## 2020-06-06 DIAGNOSIS — M79645 Pain in left finger(s): Secondary | ICD-10-CM | POA: Diagnosis not present

## 2020-06-06 DIAGNOSIS — L03012 Cellulitis of left finger: Secondary | ICD-10-CM

## 2020-06-06 DIAGNOSIS — L600 Ingrowing nail: Secondary | ICD-10-CM | POA: Diagnosis not present

## 2020-06-06 DIAGNOSIS — S6992XA Unspecified injury of left wrist, hand and finger(s), initial encounter: Secondary | ICD-10-CM

## 2020-06-06 MED ORDER — DOXYCYCLINE HYCLATE 100 MG PO CAPS: 100.0000 mg | ORAL_CAPSULE | Freq: Two times a day (BID) | ORAL | 0 refills | Status: AC

## 2020-06-06 MED ORDER — MUPIROCIN 2 % EX OINT: 1.0000 "application " | TOPICAL_OINTMENT | Freq: Three times a day (TID) | CUTANEOUS | 0 refills | Status: AC

## 2020-06-06 NOTE — Discharge Instructions (Signed)
X-ray is normal. Keep area clean and dry. Apply mupirocin ointment 3x daily and hydrocortisone cream in between applications for the ointment. Take antibiotics as prescribed. Once the swelling gets better, push the nail away from the nail fold. Follow up if no improvement or if not getting better with this treatment.

## 2020-06-06 NOTE — ED Triage Notes (Signed)
Pt c/o left middle finger pain. She states she closed her finger in her refrigerator door last night and she also has an ingrown nail on the left side of the nail that started about a week ago.

## 2020-06-06 NOTE — ED Provider Notes (Signed)
[facility]    CSN: [id number] Arrival date & time: 06/06/20  1630      History   Chief Complaint Chief Complaint  Patient presents with  . Finger Injury    HPI Sydney Banks is a 25 y.o. female presenting for pain and swelling of the left middle finger worsening over the past week. She says that she picks at her nails often. Also she says that she smashed her finger a week ago and then closed it in her fridge last night. She has been performing warm salt water soaks and applying neosporin w/o relief.  She is not taking anything for pain relief.  She denies any associated fever, tingling or numbness.  She says that she has seen some pustular material drained from around the nail after she soaks it.  Denies any similar problems the past.  No history of recurrent skin or soft tissue infections.  No other complaints or concerns.  HPI  Past Medical History:  Diagnosis Date  . Panic attacks     There are no problems to display for this patient.   History reviewed. No pertinent surgical history.  OB History   No obstetric history on file.      Home Medications    Prior to Admission medications   Medication Sig Start Date End Date Taking? Authorizing Provider  doxycycline (VIBRAMYCIN) 100 MG capsule Take 1 capsule (100 mg total) by mouth 2 (two) times daily for 5 days. 06/06/20 06/11/20 Yes Shirlee Latch, PA-C  mupirocin ointment (BACTROBAN) 2 % Apply 1 application topically 3 (three) times daily for 7 days. 06/06/20 06/13/20 Yes Shirlee Latch, PA-C  neomycin-polymyxin-hydrocortisone (CORTISPORIN) 3.5-10000-1 OTIC suspension Place 4 drops into the right ear 3 (three) times daily. 02/28/20   Becky Augusta, NP  pantoprazole (PROTONIX) 40 MG tablet Take 1 tablet (40 mg total) by mouth 2 (two) times daily. 04/02/20   Bailey Mech, NP  albuterol (PROVENTIL HFA;VENTOLIN HFA) 108 (90 Base) MCG/ACT inhaler Inhale 1-2 puffs into the lungs every 6 (six) hours as needed for  wheezing or shortness of breath. 09/22/17 02/28/20  Evon Slack, PA-C  fluticasone (FLONASE) 50 MCG/ACT nasal spray Place 1-2 sprays into both nostrils 2 (two) times daily as needed for allergies or rhinitis. 09/22/17 02/28/20  Evon Slack, PA-C    Family History History reviewed. No pertinent family history.  Social History Social History   Tobacco Use  . Smoking status: Never Smoker  . Smokeless tobacco: Never Used  Vaping Use  . Vaping Use: Never used  Substance Use Topics  . Alcohol use: No  . Drug use: No     Allergies   Patient has no known allergies.   Review of Systems Review of Systems  Constitutional: Negative for fatigue and fever.  Musculoskeletal: Positive for arthralgias and joint swelling.  Skin: Positive for color change.  Neurological: Negative for weakness and numbness.     Physical Exam Triage Vital Signs ED Triage Vitals  Enc Vitals Group     BP 06/06/20 1643 (!) 131/91     Pulse Rate 06/06/20 1643 81     Resp 06/06/20 1643 18     Temp 06/06/20 1643 98.3 F (36.8 C)     Temp Source 06/06/20 1643 Oral     SpO2 06/06/20 1643 98 %     Weight 06/06/20 1641 149 lb 14.6 oz (68 kg)     Height 06/06/20 1641 5\' 4"  (1.626 m)  Head Circumference --      Peak Flow --      Pain Score 06/06/20 1641 7     Pain Loc --      Pain Edu? --      Excl. in GC? --    No data found.  Updated Vital Signs BP (!) 131/91 (BP Location: Left Arm)   Pulse 81   Temp 98.3 F (36.8 C) (Oral)   Resp 18   Ht 5\' 4"  (1.626 m)   Wt 149 lb 14.6 oz (68 kg)   LMP 05/30/2020 (Approximate)   SpO2 98%   BMI 25.73 kg/m        Physical Exam Vitals and nursing note reviewed.  Constitutional:      General: She is not in acute distress.    Appearance: Normal appearance. She is not ill-appearing or toxic-appearing.  HENT:     Head: Normocephalic and atraumatic.  Eyes:     General: No scleral icterus.       Right eye: No discharge.        Left eye: No  discharge.     Conjunctiva/sclera: Conjunctivae normal.  Cardiovascular:     Rate and Rhythm: Normal rate and regular rhythm.     Heart sounds: Normal heart sounds.  Pulmonary:     Effort: Pulmonary effort is normal. No respiratory distress.     Breath sounds: Normal breath sounds.  Musculoskeletal:     Cervical back: Neck supple.     Comments: Left middle finger: There is mild erythema and swelling of the distal aspect of the digit.  There is mild ingrowing nail of the medial aspect of the nail.  Scant pustular drainage.  Area is diffusely tender.  She does have full range of motion of the finger at all joints.  Skin:    General: Skin is dry.  Neurological:     General: No focal deficit present.     Mental Status: She is alert. Mental status is at baseline.     Motor: No weakness.     Gait: Gait normal.  Psychiatric:        Mood and Affect: Mood normal.        Behavior: Behavior normal.        Thought Content: Thought content normal.      UC Treatments / Results  Labs (all labs ordered are listed, but only abnormal results are displayed) Labs Reviewed - No data to display  EKG   Radiology DG Finger Middle Left  Result Date: 06/06/2020 CLINICAL DATA:  Crush injury to the middle finger. EXAM: LEFT MIDDLE FINGER 2+V COMPARISON:  None. FINDINGS: There is no evidence of fracture or dislocation. There is no evidence of arthropathy or other focal bone abnormality. Soft tissues are unremarkable. No soft tissue air or radiopaque foreign body IMPRESSION: Negative radiographs of the left middle finger. Electronically Signed   By: 06/08/2020 M.D.   On: 06/06/2020 17:59    Procedures Procedures (including critical care time)  Medications Ordered in UC Medications - No data to display  Initial Impression / Assessment and Plan / UC Course  I have reviewed the triage vital signs and the nursing notes.  Pertinent labs & imaging results that were available during my care of the  patient were reviewed by me and considered in my medical decision making (see chart for details).   X-rays are negative for any acute abnormality.  Exam consistent with ingrown fingernail and cellulitis.  Ingrown fingernail  is mild. Supportive care at this time with warm soaks, mupirocin, hydrocortisone cream and doxycycline.  Advised on how to treat the nail once the swelling and infection improve.  Return and ED precautions reviewed with patient.   Final Clinical Impressions(s) / UC Diagnoses   Final diagnoses:  Ingrown nail of middle finger  Cellulitis of finger of left hand  Injury of finger of left hand, initial encounter     Discharge Instructions     X-ray is normal. Keep area clean and dry. Apply mupirocin ointment 3x daily and hydrocortisone cream in between applications for the ointment. Take antibiotics as prescribed. Once the swelling gets better, push the nail away from the nail fold. Follow up if no improvement or if not getting better with this treatment.     ED Prescriptions    Medication Sig Dispense Auth. Provider   mupirocin ointment (BACTROBAN) 2 % Apply 1 application topically 3 (three) times daily for 7 days. 22 g Eusebio Friendly B, PA-C   doxycycline (VIBRAMYCIN) 100 MG capsule Take 1 capsule (100 mg total) by mouth 2 (two) times daily for 5 days. 10 capsule Shirlee Latch, PA-C     PDMP not reviewed this encounter.   Shirlee Latch, PA-C 06/06/20 1909

## 2020-06-09 ENCOUNTER — Ambulatory Visit: Payer: BC Managed Care – PPO | Admitting: Family Medicine

## 2020-06-09 ENCOUNTER — Other Ambulatory Visit: Payer: Self-pay

## 2020-06-09 ENCOUNTER — Encounter: Payer: Self-pay | Admitting: Family Medicine

## 2020-06-09 VITALS — BP 117/79 | HR 73 | Temp 97.7°F | Resp 18 | Ht 65.5 in | Wt 165.0 lb

## 2020-06-09 DIAGNOSIS — Z114 Encounter for screening for human immunodeficiency virus [HIV]: Secondary | ICD-10-CM

## 2020-06-09 DIAGNOSIS — F411 Generalized anxiety disorder: Secondary | ICD-10-CM | POA: Diagnosis not present

## 2020-06-09 DIAGNOSIS — R002 Palpitations: Secondary | ICD-10-CM

## 2020-06-09 DIAGNOSIS — Z1159 Encounter for screening for other viral diseases: Secondary | ICD-10-CM

## 2020-06-09 NOTE — Progress Notes (Signed)
New patient visit   Patient: Sydney Banks   DOB: [birth year]   24 y.o. Female  MRN: [id number] Visit Date: 06/09/2020  Today's healthcare provider: Shirlee Latch, MD   Chief Complaint  Patient presents with  . Establish Care   Subjective    Sydney Banks is a 25 y.o. female who presents today as a new patient to establish care. Patient reports no previous PCP. She reports feeling fairly well. Patient is recovering well from recent COVID-19 infection on 05/23/2018.  HPI   Recent palpitations after eating. Gets lightheaded and has to lay down. This part is improving, but still getting tachycardic when eating and at other random times. Started a few months ago before COVID.  No SOB.  Not anxious or nervous. Never had anything like this before.  Has not been worked up. No family history.  Had GERD, negative H pylori in November 2021 and was treated at [facility]. No abd pain since then.  H/o panic attacks, but rare now.  Anxious occasionally. Never treated for this before.  Not where it should.  Worsened by being in a new situation with new people.  Started on Doxycycline for cellulitis of her finger on Monday. Getting better.  Past Medical History:  Diagnosis Date  . Anxiety   . Panic attacks    Past Surgical History:  Procedure Laterality Date  . NO PAST SURGERIES     Family Status  Relation Name Status  . Mother  Alive  . Father  Alive  . Sib  (Not Specified)  . PGF  (Not Specified)  . Mat Uncle  (Not Specified)  . Neg Hx  (Not Specified)   Family History  Problem Relation Age of Onset  . Gallbladder disease Father   . Hypertension Father   . Down syndrome Sibling   . Lung cancer Paternal Grandfather   . Thyroid cancer Maternal Uncle   . Breast cancer Neg Hx   . Colon cancer Neg Hx    Social History   Socioeconomic History  . Marital status: Single    Spouse name: Not on file  . Number of children: 0  . Years of education: Not on file  . Highest education  level: Not on file  Occupational History  . Occupation: Midwife  Tobacco Use  . Smoking status: Never Smoker  . Smokeless tobacco: Never Used  Vaping Use  . Vaping Use: Never used  Substance and Sexual Activity  . Alcohol use: No  . Drug use: No  . Sexual activity: Not Currently  Other Topics Concern  . Not on file  Social History Narrative  . Not on file   Social Determinants of Health   Financial Resource Strain: Not on file  Food Insecurity: Not on file  Transportation Needs: Not on file  Physical Activity: Not on file  Stress: Not on file  Social Connections: Not on file   Outpatient Medications Prior to Visit  Medication Sig  . doxycycline (VIBRAMYCIN) 100 MG capsule Take 1 capsule (100 mg total) by mouth 2 (two) times daily for 5 days.  . mupirocin ointment (BACTROBAN) 2 % Apply 1 application topically 3 (three) times daily for 7 days. (Patient not taking: Reported on 06/09/2020)  . [DISCONTINUED] neomycin-polymyxin-hydrocortisone (CORTISPORIN) 3.5-10000-1 OTIC suspension Place 4 drops into the right ear 3 (three) times daily.  . [DISCONTINUED] pantoprazole (PROTONIX) 40 MG tablet Take 1 tablet (40 mg total) by mouth 2 (two) times daily.   No facility-administered  medications prior to visit.   No Known Allergies  Immunization History  Administered Date(s) Administered  . PFIZER(Purple Top)SARS-COV-2 Vaccination 01/22/2020, 02/12/2020    Health Maintenance  Topic Date Due  . Hepatitis C Screening  Never done  . TETANUS/TDAP  Never done  . PAP-Cervical Cytology Screening  Never done  . PAP SMEAR-Modifier  Never done  . INFLUENZA VACCINE  08/04/2020 (Originally 12/06/2019)  . COVID-19 Vaccine (3 - Booster for Pfizer series) 08/12/2020  . HIV Screening  Completed    Patient Care Team: Erasmo Downer, MD as PCP - General (Family Medicine)  Review of Systems  Constitutional: Positive for appetite change and fatigue.  HENT: Negative.   Eyes:  Negative.   Respiratory: Positive for chest tightness and shortness of breath.   Cardiovascular: Positive for palpitations.  Gastrointestinal: Negative.   Endocrine: Negative.   Genitourinary: Negative.   Musculoskeletal: Negative.   Skin: Negative.   Allergic/Immunologic: Negative.   Neurological: Positive for light-headedness.  Hematological: Negative.   Psychiatric/Behavioral: The patient is nervous/anxious.     Last CBC Lab Results  Component Value Date   WBC 9.5 04/22/2017   HGB 15.2 04/22/2017   HCT 44.6 04/22/2017   MCV 87.2 04/22/2017   MCH 29.7 04/22/2017   RDW 13.2 04/22/2017   PLT 286 04/22/2017   Last metabolic panel Lab Results  Component Value Date   GLUCOSE 93 04/02/2020   NA 140 04/02/2020   K 4.2 04/02/2020   CL 103 04/02/2020   CO2 26 04/02/2020   BUN 10 04/02/2020   CREATININE 0.69 04/02/2020   GFRNONAA >60 04/02/2020   CALCIUM 9.6 04/02/2020   ANIONGAP 11 04/02/2020      Objective    BP 117/79 (BP Location: Right Arm, Patient Position: Sitting, Cuff Size: Normal)   Pulse 73   Temp 97.7 F (36.5 C) (Temporal)   Resp 18   Ht 5' 5.5" (1.664 m)   Wt 165 lb (74.8 kg)   LMP 05/30/2020 (Approximate)   BMI 27.04 kg/m  Physical Exam Vitals reviewed.  Constitutional:      General: She is not in acute distress.    Appearance: Normal appearance. She is well-developed. She is not diaphoretic.  HENT:     Head: Normocephalic and atraumatic.     Right Ear: Tympanic membrane, ear canal and external ear normal.     Left Ear: Tympanic membrane, ear canal and external ear normal.  Eyes:     General: No scleral icterus.    Conjunctiva/sclera: Conjunctivae normal.     Pupils: Pupils are equal, round, and reactive to light.  Neck:     Thyroid: No thyromegaly.  Cardiovascular:     Rate and Rhythm: Normal rate and regular rhythm.     Pulses: Normal pulses.     Heart sounds: Normal heart sounds. No murmur heard.   Pulmonary:     Effort: Pulmonary  effort is normal. No respiratory distress.     Breath sounds: Normal breath sounds. No wheezing or rales.  Abdominal:     General: There is no distension.     Palpations: Abdomen is soft.     Tenderness: There is no abdominal tenderness.  Musculoskeletal:        General: No deformity.     Cervical back: Neck supple.     Right lower leg: No edema.     Left lower leg: No edema.  Lymphadenopathy:     Cervical: No cervical adenopathy.  Skin:    General:  Skin is warm and dry.     Findings: No rash.  Neurological:     Mental Status: She is alert and oriented to person, place, and time. Mental status is at baseline.     Gait: Gait normal.  Psychiatric:        Mood and Affect: Mood normal.        Behavior: Behavior normal.        Thought Content: Thought content normal.      Depression Screen PHQ 2/9 Scores 06/09/2020  PHQ - 2 Score 0  PHQ- 9 Score 2   No results found for any visits on 06/09/20.  Assessment & Plan      Problem List Items Addressed This Visit      Other   Palpitations - Primary    New problem x 4 months Benign exam today Discussed wide differential EKG benign Check CBC, CMP, TSH Referral to Cards for longer-term cardiac monitor/eval       Relevant Orders   Ambulatory referral to Cardiology   EKG 12-Lead (Completed)   CBC   Comprehensive metabolic panel   TSH   GAD (generalized anxiety disorder)    Long standing issue, but new diagnosis Not on any medications and would like to avoid if possible Discussed the benefits of therapy (patient has not tried this previously) Gave resources for finding a therapist Checking CMP, CBC, TSH as below for palpitations will also inform us if there are metabolic issues that could contribute to anxiety as well      Relevant Orders   CBC   Comprehensive metabolic panel   TSH    Other Visit Diagnoses    Need for hepatitis C screening test       Relevant Orders   Hepatitis C Antibody   Encounter for screening  for HIV       Relevant Orders   HIV antibody (with reflex)       Return in about 5 months (around 11/06/2020).     Total time spent on today's visit was greater than 45 minutes, including both face-to-face time and nonface-to-face time personally spent on review of chart (labs and imaging), discussing labs and goals, discussing further work-up, treatment options, referrals to specialist if needed, reviewing outside records of pertinent, answering patient's questions, and coordinating care.    I, Shirlee Latch, MD, have reviewed all documentation for this visit. The documentation on 06/09/20 for the exam, diagnosis, procedures, and orders are all accurate and complete.   Tehila Sokolow, Marzella Schlein, MD, MPH [facility]

## 2020-06-09 NOTE — Assessment & Plan Note (Signed)
New problem x 4 months Benign exam today Discussed wide differential EKG benign Check CBC, CMP, TSH Referral to Cards for longer-term cardiac monitor/eval

## 2020-06-09 NOTE — Assessment & Plan Note (Signed)
Long standing issue, but new diagnosis Not on any medications and would like to avoid if possible Discussed the benefits of therapy (patient has not tried this previously) Gave resources for finding a therapist Checking CMP, CBC, TSH as below for palpitations will also inform us if there are metabolic issues that could contribute to anxiety as well

## 2020-06-09 NOTE — Patient Instructions (Addendum)
Reach out to a therapist (can check [email or web address] for other options)  Reach out to your college about shot record and send it through [place] or bring to next visit

## 2020-06-10 LAB — COMPREHENSIVE METABOLIC PANEL
ALT: 12 IU/L (ref 0–32)
AST: 16 IU/L (ref 0–40)
Albumin/Globulin Ratio: 1.6 (ref 1.2–2.2)
Albumin: 4.7 g/dL (ref 3.9–5.0)
Alkaline Phosphatase: 59 IU/L (ref 44–121)
BUN/Creatinine Ratio: 11 (ref 9–23)
BUN: 8 mg/dL (ref 6–20)
Bilirubin Total: 0.2 mg/dL (ref 0.0–1.2)
CO2: 24 mmol/L (ref 20–29)
Calcium: 9.8 mg/dL (ref 8.7–10.2)
Chloride: 101 mmol/L (ref 96–106)
Creatinine, Ser: 0.74 mg/dL (ref 0.57–1.00)
GFR calc Af Amer: 129 mL/min/{1.73_m2} (ref >59)
GFR calc non Af Amer: 112 mL/min/{1.73_m2} (ref >59)
Globulin, Total: 3 g/dL (ref 1.5–4.5)
Glucose: 84 mg/dL (ref 65–99)
Potassium: 4.2 mmol/L (ref 3.5–5.2)
Sodium: 140 mmol/L (ref 134–144)
Total Protein: 7.7 g/dL (ref 6.0–8.5)

## 2020-06-10 LAB — CBC
Hematocrit: 41.4 % (ref 34.0–46.6)
Hemoglobin: 14.3 g/dL (ref 11.1–15.9)
MCH: 29.1 pg (ref 26.6–33.0)
MCHC: 34.5 g/dL (ref 31.5–35.7)
MCV: 84 fL (ref 79–97)
Platelets: 324 10*3/uL (ref 150–450)
RBC: 4.91 x10E6/uL (ref 3.77–5.28)
RDW: 12.2 % (ref 11.7–15.4)
WBC: 7.2 10*3/uL (ref 3.4–10.8)

## 2020-06-10 LAB — HIV ANTIBODY (ROUTINE TESTING W REFLEX): HIV Screen 4th Generation wRfx: NONREACTIVE

## 2020-06-10 LAB — HEPATITIS C ANTIBODY: Hep C Virus Ab: <0.1 s/co ratio (ref 0.0–0.9)

## 2020-06-10 LAB — TSH: TSH: 1.06 u[IU]/mL (ref 0.450–4.500)

## 2020-06-17 ENCOUNTER — Ambulatory Visit: Payer: BC Managed Care – PPO | Admitting: Cardiology

## 2020-06-17 ENCOUNTER — Other Ambulatory Visit: Payer: Self-pay

## 2020-06-17 ENCOUNTER — Ambulatory Visit (INDEPENDENT_AMBULATORY_CARE_PROVIDER_SITE_OTHER): Payer: BC Managed Care – PPO

## 2020-06-17 ENCOUNTER — Encounter: Payer: Self-pay | Admitting: Cardiology

## 2020-06-17 VITALS — BP 110/78 | HR 81 | Ht 65.0 in | Wt 162.0 lb

## 2020-06-17 DIAGNOSIS — R002 Palpitations: Secondary | ICD-10-CM | POA: Diagnosis not present

## 2020-06-17 NOTE — Patient Instructions (Signed)
Medication Instructions:  Your physician recommends that you continue on your current medications as directed. Please refer to the Current Medication list given to you today.  *If you need a refill on your cardiac medications before your next appointment, please call your pharmacy*   Lab Work: None ordered If you have labs (blood work) drawn today and your tests are completely normal, you will receive your results only by: . MyChart Message (if you have MyChart) OR . A paper copy in the mail If you have any lab test that is abnormal or we need to change your treatment, we will call you to review the results.   Testing/Procedures:  Your physician has recommended that you wear a Zio monitor for 2 weeks. This monitor is a medical device that records the heart's electrical activity. Doctors most often use these monitors to diagnose arrhythmias. Arrhythmias are problems with the speed or rhythm of the heartbeat. The monitor is a small device applied to your chest. You can wear one while you do your normal daily activities. While wearing this monitor if you have any symptoms to push the button and record what you felt. Once you have worn this monitor for the period of time provider prescribed (Usually 14 days), you will return the monitor device in the postage paid box. Once it is returned they will download the data collected and provide us with a report which the provider will then review and we will call you with those results. Important tips:  1. Avoid showering during the first 24 hours of wearing the monitor. 2. Avoid excessive sweating to help maximize wear time. 3. Do not submerge the device, no hot tubs, and no swimming pools. 4. Keep any lotions or oils away from the patch. 5. After 24 hours you may shower with the patch on. Take brief showers with your back facing the shower head.  6. Do not remove patch once it has been placed because that will interrupt data and decrease adhesive wear  time. 7. Push the button when you have any symptoms and write down what you were feeling. 8. Once you have completed wearing your monitor, remove and place into box which has postage paid and place in your outgoing mailbox.  9. If for some reason you have misplaced your box then call our office and we can provide another box and/or mail it off for you.         Follow-Up: At [facility], you and your health needs are our priority.  As part of our continuing mission to provide you with exceptional heart care, we have created designated Provider Care Teams.  These Care Teams include your primary Cardiologist (physician) and Advanced Practice Providers (APPs -  Physician Assistants and Nurse Practitioners) who all work together to provide you with the care you need, when you need it.  We recommend signing up for the patient portal called "MyChart".  Sign up information is provided on this After Visit Summary.  MyChart is used to connect with patients for Virtual Visits (Telemedicine).  Patients are able to view lab/test results, encounter notes, upcoming appointments, etc.  Non-urgent messages can be sent to your provider as well.   To learn more about what you can do with MyChart, go to [email or web address].    Your next appointment:   5-6 week(s)  The format for your next appointment:   In Person  Provider:   Brian Agbor-Etang, MD   Other Instructions   

## 2020-06-17 NOTE — Progress Notes (Signed)
Cardiology Office Note:    Date:  06/17/2020   ID:  Sydney Banks, DOB Jan 20, 1996, MRN [id number]  PCP:  Erasmo Downer, MD   [facility]  Cardiologist:  Debbe Odea, MD  Advanced Practice Provider:  No care team member to display Electrophysiologist:  None       Referring MD: Erasmo Downer, MD   Chief Complaint  Patient presents with  . New Patient (Initial Visit)    Referred by pcp for palpitations- at random. Meds reviewed verbally with patient.    Sydney Banks is a 25 y.o. female who is being seen today for the evaluation of palpitations at the request of Bacigalupo, Marzella Schlein, MD.   History of Present Illness:    Sydney Banks is a 25 y.o. female with a hx of anxiety who presents due to palpitations.  Patient states having symptoms of palpitations over the past 2 months.  Symptoms occur randomly about every 8 days, can last anywhere from a few seconds to 45 minutes.  She usually feels fatigued after palpitations.  Denies any history of heart disease, denies smoking.  She usually runs 2 to 3 miles but now not able to do up to 1 mile without having symptoms and stopping.  She checked her heart rate using her smart watch during 1 such occurrence, heart rates up to 130 bpm noted.  Past Medical History:  Diagnosis Date  . Anxiety   . Panic attacks     Past Surgical History:  Procedure Laterality Date  . NO PAST SURGERIES      Current Medications: No outpatient medications have been marked as taking for the 06/17/20 encounter (Office Visit) with Debbe Odea, MD.     Allergies:   Patient has no known allergies.   Social History   Socioeconomic History  . Marital status: Single    Spouse name: Not on file  . Number of children: 0  . Years of education: Not on file  . Highest education level: Not on file  Occupational History  . Occupation: Midwife  Tobacco Use  . Smoking status: Never Smoker  .  Smokeless tobacco: Never Used  Vaping Use  . Vaping Use: Never used  Substance and Sexual Activity  . Alcohol use: No  . Drug use: No  . Sexual activity: Not Currently  Other Topics Concern  . Not on file  Social History Narrative  . Not on file   Social Determinants of Health   Financial Resource Strain: Not on file  Food Insecurity: Not on file  Transportation Needs: Not on file  Physical Activity: Not on file  Stress: Not on file  Social Connections: Not on file     Family History: The patient's family history includes Down syndrome in her sibling; Gallbladder disease in her father; Hypertension in her father; Lung cancer in her paternal grandfather; Thyroid cancer in her maternal uncle. There is no history of Breast cancer or Colon cancer.  ROS:   Please see the history of present illness.     All other systems reviewed and are negative.  EKGs/Labs/Other Studies Reviewed:    The following studies were reviewed today:   EKG:  EKG is  ordered today.  The ekg ordered today demonstrates normal sinus rhythm, normal ECG.  Recent Labs: 06/09/2020: ALT 12; BUN 8; Creatinine, Ser 0.74; Hemoglobin 14.3; Platelets 324; Potassium 4.2; Sodium 140; TSH 1.060  Recent Lipid Panel No results found for: CHOL, TRIG,  HDL, CHOLHDL, VLDL, LDLCALC, LDLDIRECT   Risk Assessment/Calculations:      Physical Exam:    VS:  BP 110/78 (BP Location: Right Arm, Patient Position: Sitting, Cuff Size: Normal)   Pulse 81   Ht 5\' 5"  (1.651 m)   Wt 162 lb (73.5 kg)   LMP 05/30/2020 (Approximate)   SpO2 98%   BMI 26.96 kg/m     Wt Readings from Last 3 Encounters:  06/17/20 162 lb (73.5 kg)  06/09/20 165 lb (74.8 kg)  06/06/20 149 lb 14.6 oz (68 kg)     GEN:  Well nourished, well developed in no acute distress HEENT: Normal NECK: No JVD; No carotid bruits LYMPHATICS: No lymphadenopathy CARDIAC: RRR, no murmurs, rubs, gallops RESPIRATORY:  Clear to auscultation without rales, wheezing or  rhonchi  ABDOMEN: Soft, non-tender, non-distended MUSCULOSKELETAL:  No edema; No deformity  SKIN: Warm and dry NEUROLOGIC:  Alert and oriented x 3 PSYCHIATRIC:  Normal affect   ASSESSMENT:    1. Palpitations    PLAN:    In order of problems listed above:  1. Palpitations, symptoms concerning for possible SVT or other atrial arrhythmias.  Place a cardiac monitor x2 weeks to evaluate for significant arrhythmias.  Follow-up after cardiac monitor.    Medication Adjustments/Labs and Tests Ordered: Current medicines are reviewed at length with the patient today.  Concerns regarding medicines are outlined above.  Orders Placed This Encounter  Procedures  . LONG TERM MONITOR (3-14 DAYS)  . EKG 12-Lead   No orders of the defined types were placed in this encounter.   Patient Instructions  Medication Instructions:  Your physician recommends that you continue on your current medications as directed. Please refer to the Current Medication list given to you today.  *If you need a refill on your cardiac medications before your next appointment, please call your pharmacy*   Lab Work: None ordered If you have labs (blood work) drawn today and your tests are completely normal, you will receive your results only by: 06/08/20 MyChart Message (if you have MyChart) OR . A paper copy in the mail If you have any lab test that is abnormal or we need to change your treatment, we will call you to review the results.   Testing/Procedures:  Your physician has recommended that you wear a Zio monitor for 2 weeks. This monitor is a medical device that records the heart's electrical activity. Doctors most often use these monitors to diagnose arrhythmias. Arrhythmias are problems with the speed or rhythm of the heartbeat. The monitor is a small device applied to your chest. You can wear one while you do your normal daily activities. While wearing this monitor if you have any symptoms to push the button and  record what you felt. Once you have worn this monitor for the period of time provider prescribed (Usually 14 days), you will return the monitor device in the postage paid box. Once it is returned they will download the data collected and provide Marland Kitchen with a report which the provider will then review and we will call you with those results. Important tips:  1. Avoid showering during the first 24 hours of wearing the monitor. 2. Avoid excessive sweating to help maximize wear time. 3. Do not submerge the device, no hot tubs, and no swimming pools. 4. Keep any lotions or oils away from the patch. 5. After 24 hours you may shower with the patch on. Take brief showers with your back facing the shower head.  6. Do not remove patch once it has been placed because that will interrupt data and decrease adhesive wear time. 7. Push the button when you have any symptoms and write down what you were feeling. 8. Once you have completed wearing your monitor, remove and place into box which has postage paid and place in your outgoing mailbox.  9. If for some reason you have misplaced your box then call our office and we can provide another box and/or mail it off for you.         Follow-Up: At [facility], you and your health needs are our priority.  As part of our continuing mission to provide you with exceptional heart care, we have created designated Provider Care Teams.  These Care Teams include your primary Cardiologist (physician) and Advanced Practice Providers (APPs -  Physician Assistants and Nurse Practitioners) who all work together to provide you with the care you need, when you need it.  We recommend signing up for the patient portal called "MyChart".  Sign up information is provided on this After Visit Summary.  MyChart is used to connect with patients for Virtual Visits (Telemedicine).  Patients are able to view lab/test results, encounter notes, upcoming appointments, etc.  Non-urgent messages  can be sent to your provider as well.   To learn more about what you can do with MyChart, go to [email or web address].    Your next appointment:   5-6 weeks   The format for your next appointment:   In Person  Provider:   Debbe Odea, MD   Other Instructions       Signed, Debbe Odea, MD  06/17/2020 5:13 PM    [facility]

## 2020-06-20 ENCOUNTER — Telehealth: Payer: Self-pay | Admitting: Cardiology

## 2020-06-20 ENCOUNTER — Telehealth: Payer: Self-pay | Admitting: Cardiovascular Disease

## 2020-06-20 NOTE — Telephone Encounter (Signed)
error 

## 2020-06-20 NOTE — Telephone Encounter (Signed)
Patient had to remove monitor early due to allergic reaction.  Please call to advise on POC.

## 2020-06-20 NOTE — Telephone Encounter (Signed)
Tried to call patient back and it went to VM but was unable to leave a message.

## 2020-06-21 NOTE — Telephone Encounter (Signed)
Called and spoke with patient. She wore the monitor for about 48 hours and then called irhythm who instructed her to remove it because of the skin irritation. They also recommended that she send the monitor in. She stated that her skin started to turn red and was spreading towards her neck. She stated that her skin is no longer itching and the redness is improving.  Will forward to Dr. Azucena Cecil for review.

## 2020-06-22 NOTE — Telephone Encounter (Signed)
Pt contacted office wanting to verify if she needed to schedule a future appointment to have another monitor placed due to her having to remove monitor prev. ordered due to skin irritation. She was made aware that she has a scheduled f/u with Agbor Etang 07/22/2020.  I spoke with Coleen, RN and she mentioned that pt doesn't need to reschedule for monitor at this time until Dr.Agbor addresses telephone note and see if he would like her to wear another monitor. Nurse will contact pt if pt needs to follow up sooner.

## 2020-07-05 ENCOUNTER — Telehealth: Payer: Self-pay

## 2020-07-05 DIAGNOSIS — R002 Palpitations: Secondary | ICD-10-CM

## 2020-07-05 NOTE — Telephone Encounter (Signed)
Copied from CRM [id number]. Topic: Referral - Question >> Jul 05, 2020 12:46 PM Pawlus, Sydney Banks wrote: Reason for CRM: Pt wanted Banks new referral for Banks cardiologist, pt requested to have Banks new referral for Dr. Lalla Brothers. Please advise.

## 2020-07-06 NOTE — Telephone Encounter (Signed)
Referral placed.

## 2020-07-22 ENCOUNTER — Ambulatory Visit: Payer: BC Managed Care – PPO | Admitting: Cardiology

## 2020-07-27 ENCOUNTER — Other Ambulatory Visit: Payer: Self-pay

## 2020-07-27 ENCOUNTER — Ambulatory Visit: Payer: BC Managed Care – PPO | Admitting: Cardiology

## 2020-07-27 ENCOUNTER — Encounter: Payer: Self-pay | Admitting: Cardiology

## 2020-07-27 ENCOUNTER — Encounter: Payer: Self-pay | Admitting: *Deleted

## 2020-07-27 ENCOUNTER — Ambulatory Visit (INDEPENDENT_AMBULATORY_CARE_PROVIDER_SITE_OTHER): Payer: BC Managed Care – PPO

## 2020-07-27 VITALS — BP 116/76 | HR 95 | Ht 65.0 in | Wt 160.8 lb

## 2020-07-27 DIAGNOSIS — R002 Palpitations: Secondary | ICD-10-CM

## 2020-07-27 NOTE — Progress Notes (Signed)
Patient ID: Sydney Banks, female   DOB: 11-28-1995, 25 y.o.   MRN: [id number] Patient enrolled for [employer] to ship a 14 day Long term holter monitor with bridge and sensitive skin electrodes to her home.

## 2020-07-27 NOTE — Progress Notes (Signed)
Electrophysiology Office Note:    Date:  07/27/2020   ID:  Sydney Banks, DOB 24-Dec-1995, MRN [id number]  PCP:  Erasmo Downer, MD  [facility] Cardiologist:  Debbe Odea, MD  [facility] Electrophysiologist:  Lanier Prude, MD   Referring MD: Erasmo Downer, MD   Chief Complaint: Palpitations  History of Present Illness:    Sydney Banks is a 25 y.o. female who presents for an evaluation of palpitations at the request of Dr. Beryle Flock. Their medical history includes anxiety.  The patient last saw Dr. Azucena Cecil June 17, 2020 the palpitations.  She reported at that appointment that she had been experiencing palpitations for at least 2 months.  They can last for many minutes at a time.  She has a family history of cardiovascular disease and so she is concerned that her symptoms may represent some underlying heart disease.  She tells me that she has a heart rate monitor on her watch and keep a check on her heart rhythm and rates.  She tells me with minimal amounts of exertion her heart rates will increase to 160 or 170 bpm.  She has previously been very active and would run several miles but this has decreased recently.  She wore a heart monitor to investigate her palpitations but was only able to wear it for about 1 day because of skin irritation associated with the adhesive.  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  Past Medical History:  Diagnosis Date  . Anxiety   . Panic attacks     Past Surgical History:  Procedure Laterality Date  . NO PAST SURGERIES      Current Medications: No outpatient medications have been marked as taking for the 07/27/20 encounter (Office Visit) with Lanier Prude, MD.     Allergies:   Patient has no known allergies.   Social History   Socioeconomic History  . Marital status: Single    Spouse name: Not on file  . Number of children: 0  . Years of education: Not on file  . Highest  education level: Not on file  Occupational History  . Occupation: Midwife  Tobacco Use  . Smoking status: Never Smoker  . Smokeless tobacco: Never Used  Vaping Use  . Vaping Use: Never used  Substance and Sexual Activity  . Alcohol use: No  . Drug use: No  . Sexual activity: Not Currently  Other Topics Concern  . Not on file  Social History Narrative  . Not on file   Social Determinants of Health   Financial Resource Strain: Not on file  Food Insecurity: Not on file  Transportation Needs: Not on file  Physical Activity: Not on file  Stress: Not on file  Social Connections: Not on file     Family History: The patient's family history includes Down syndrome in her sibling; Gallbladder disease in her father; Hypertension in her father; Lung cancer in her paternal grandfather; Thyroid cancer in her maternal uncle. There is no history of Breast cancer or Colon cancer.  ROS:   Please see the history of present illness.    All other systems reviewed and are negative.  EKGs/Labs/Other Studies Reviewed:    The following studies were reviewed today:  June 23, 2020 ZIO personally reviewed Daytime heart rate slightly elevated with average rates above 100 bpm during the awake hours.  Appropriate variation between night and day heart rates.  No sustained arrhythmias.  Symptoms triggers correspond  to sinus rhythm with PACs.   EKG:  The ekg ordered today demonstrates sinus rhythm.  Recent Labs: 06/09/2020: ALT 12; BUN 8; Creatinine, Ser 0.74; Hemoglobin 14.3; Platelets 324; Potassium 4.2; Sodium 140; TSH 1.060  Recent Lipid Panel No results found for: CHOL, TRIG, HDL, CHOLHDL, VLDL, LDLCALC, LDLDIRECT  Physical Exam:    VS:  BP 116/76   Pulse 95   Ht 5\' 5"  (1.651 m)   Wt 160 lb 12.8 oz (72.9 kg)   SpO2 98%   BMI 26.76 kg/m     Wt Readings from Last 3 Encounters:  07/27/20 160 lb 12.8 oz (72.9 kg)  06/17/20 162 lb (73.5 kg)  06/09/20 165 lb (74.8 kg)      GEN:  Well nourished, well developed in no acute distress HEENT: Normal NECK: No JVD; No carotid bruits LYMPHATICS: No lymphadenopathy CARDIAC: RRR, no murmurs, rubs, gallops RESPIRATORY:  Clear to auscultation without rales, wheezing or rhonchi  ABDOMEN: Soft, non-tender, non-distended MUSCULOSKELETAL:  No edema; No deformity  SKIN: Warm and dry NEUROLOGIC:  Alert and oriented x 3 PSYCHIATRIC:  Normal affect   ASSESSMENT:    1. Palpitations    PLAN:    In order of problems listed above:  1. Palpitations From reviewing her ZIO monitor, I suspect many of her symptoms are due to to symptomatic PACs.  There may be an element of inappropriate sinus tachycardia as well given the elevated average daytime heart rates.  I would like to confirm no structural heart disease with an echocardiogram and repeat a heart monitor using a less irritating skin patch.  I will plan to see her back in clinic to discuss the results after the echo and heart monitor have returned.  At that appointment we will discuss whether or not she wants to try metoprolol to help alleviate some of the symptoms.  Total time spent with patient today 47 minutes. This includes reviewing records, evaluating the patient and coordinating care.  Medication Adjustments/Labs and Tests Ordered: Current medicines are reviewed at length with the patient today.  Concerns regarding medicines are outlined above.  Orders Placed This Encounter  Procedures  . LONG TERM MONITOR (3-14 DAYS)  . EKG 12-Lead  . ECHOCARDIOGRAM COMPLETE   No orders of the defined types were placed in this encounter.    Signed, 08/07/20, MD, [facility]  07/27/2020 8:21 PM    Electrophysiology  [facility]

## 2020-07-27 NOTE — Patient Instructions (Addendum)
Medication Instructions:  Your physician recommends that you continue on your current medications as directed. Please refer to the Current Medication list given to you today. *If you need a refill on your cardiac medications before your next appointment, please call your pharmacy*  Lab Work: None ordered. If you have labs (blood work) drawn today and your tests are completely normal, you will receive your results only by: Marland Kitchen MyChart Message (if you have MyChart) OR . A paper copy in the mail If you have any lab test that is abnormal or we need to change your treatment, we will call you to review the results.  Testing/Procedures: Your physician has requested that you have an echocardiogram. Echocardiography is a painless test that uses sound waves to create images of your heart. It provides your doctor with information about the size and shape of your heart and how well your heart's chambers and valves are working. This procedure takes approximately one hour. There are no restrictions for this procedure.  Please schedule for ECHO  Your physician has recommended that you wear a holter monitor. Holter monitors are medical devices that record the heart's electrical activity. Doctors most often use these monitors to diagnose arrhythmias. Arrhythmias are problems with the speed or rhythm of the heartbeat. The monitor is a small, portable device. You can wear one while you do your normal daily activities. This is usually used to diagnose what is causing palpitations/syncope (passing out).  You will wear a preventice monitor for 14 days.  Follow-Up: At [facility], you and your health needs are our priority.  As part of our continuing mission to provide you with exceptional heart care, we have created designated Provider Care Teams.  These Care Teams include your primary Cardiologist (physician) and Advanced Practice Providers (APPs -  Physician Assistants and Nurse Practitioners) who all work  together to provide you with the care you need, when you need it.  Your next appointment:   Your physician wants you to follow-up in: 4-6 weeks with Dr. Lalla Brothers.   [employer] Cardiac Event Monitor Instructions Your physician has requested you wear your cardiac event monitor for _14_ days, (1-30). [employer] may call or text to confirm a shipping address. The monitor will be sent to a land address via UPS. [employer] will not ship a monitor to a PO BOX. It typically takes 3-5 days to receive your monitor after it has been enrolled. [employer] will assist with USPS tracking if your package is delayed. The telephone number for [employer] is [phone]. Once you have received your monitor, please review the enclosed instructions. Instruction tutorials can also be viewed under help and settings on the enclosed cell phone. Your monitor has already been registered assigning a specific monitor serial # to you.  Applying the monitor Remove cell phone from case and turn it on. The cell phone works as IT consultant and needs to be within [employer] of you at all times. The cell phone will need to be charged on a daily basis. We recommend you plug the cell phone into the enclosed charger at your bedside table every night.  Monitor batteries: You will receive two monitor batteries labelled #1 and #2. These are your recorders. Plug battery #2 onto the second connection on the enclosed charger. Keep one battery on the charger at all times. This will keep the monitor battery deactivated. It will also keep it fully charged for when you need to switch your monitor batteries. A small light will be blinking on  the battery emblem when it is charging. The light on the battery emblem will remain on when the battery is fully charged.  Open package of a Monitor strip. Insert battery #1 into black hood on strip and gently squeeze monitor battery onto connection as indicated in instruction booklet. Set aside  while preparing skin.  Choose location for your strip, vertical or horizontal, as indicated in the instruction booklet. Shave to remove all hair from location. There cannot be any lotions, oils, powders, or colognes on skin where monitor is to be applied. Wipe skin clean with enclosed Saline wipe. Dry skin completely.  Peel paper labeled #1 off the back of the Monitor strip exposing the adhesive. Place the monitor on the chest in the vertical or horizontal position shown in the instruction booklet. One arrow on the monitor strip must be pointing upward. Carefully remove paper labeled #2, attaching remainder of strip to your skin. Try not to create any folds or wrinkles in the strip as you apply it.  Firmly press and release the circle in the center of the monitor battery. You will hear a small beep. This is turning the monitor battery on. The heart emblem on the monitor battery will light up every 5 seconds if the monitor battery in turned on and connected to the patient securely. Do not push and hold the circle down as this turns the monitor battery off. The cell phone will locate the monitor battery. A screen will appear on the cell phone checking the connection of your monitor strip. This may read poor connection initially but change to good connection within the next minute. Once your monitor accepts the connection you will hear a series of 3 beeps followed by a climbing crescendo of beeps. A screen will appear on the cell phone showing the two monitor strip placement options. Touch the picture that demonstrates where you applied the monitor strip.  Your monitor strip and battery are waterproof. You are able to shower, bathe, or swim with the monitor on. They just ask you do not submerge deeper than 3 feet underwater. We recommend removing the monitor if you are swimming in a lake, river, or ocean.  Your monitor battery will need to be switched to a fully charged monitor battery  approximately once a week. The cell phone will alert you of an action which needs to be made.  On the cell phone, tap for details to reveal connection status, monitor battery status, and cell phone battery status. The green dots indicates your monitor is in good status. A red dot indicates there is something that needs your attention.  To record a symptom, click the circle on the monitor battery. In 30-60 seconds a list of symptoms will appear on the cell phone. Select your symptom and tap save. Your monitor will record a sustained or significant arrhythmia regardless of you clicking the button. Some patients do not feel the heart rhythm irregularities. [employer] will notify us of any serious or critical events.  Refer to instruction booklet for instructions on switching batteries, changing strips, the Do not disturb or Pause features, or any additional questions.  Call [employer] at [phone], to confirm your monitor is transmitting and record your baseline. They will answer any questions you may have regarding the monitor instructions at that time.  Returning the monitor to [employer] Place all equipment back into blue box. Peel off strip of paper to expose adhesive and close box securely. There is a prepaid UPS shipping label on this  box. Drop in a UPS drop box, or at a UPS facility like [employer]. You may also contact [employer] to arrange UPS to pick up monitor package at your home.

## 2020-08-01 DIAGNOSIS — R002 Palpitations: Secondary | ICD-10-CM | POA: Diagnosis not present

## 2020-08-16 ENCOUNTER — Other Ambulatory Visit: Payer: Self-pay | Admitting: Cardiology

## 2020-08-16 DIAGNOSIS — I491 Atrial premature depolarization: Secondary | ICD-10-CM

## 2020-08-16 DIAGNOSIS — R000 Tachycardia, unspecified: Secondary | ICD-10-CM

## 2020-08-23 ENCOUNTER — Ambulatory Visit (INDEPENDENT_AMBULATORY_CARE_PROVIDER_SITE_OTHER): Payer: BC Managed Care – PPO

## 2020-08-23 ENCOUNTER — Other Ambulatory Visit: Payer: Self-pay

## 2020-08-23 DIAGNOSIS — R000 Tachycardia, unspecified: Secondary | ICD-10-CM | POA: Diagnosis not present

## 2020-08-23 DIAGNOSIS — I491 Atrial premature depolarization: Secondary | ICD-10-CM

## 2020-08-24 ENCOUNTER — Telehealth: Payer: Self-pay | Admitting: Cardiology

## 2020-08-24 LAB — ECHOCARDIOGRAM COMPLETE
AR max vel: 2.58 cm2
AV Area VTI: 2.37 cm2
AV Area mean vel: 2.34 cm2
AV Mean grad: 3 mmHg
AV Peak grad: 6 mmHg
Ao pk vel: 1.22 m/s
Area-P 1/2: 3.51 cm2
Calc EF: 64 %
S' Lateral: 2.8 cm
Single Plane A2C EF: 64.5 %
Single Plane A4C EF: 65.7 %

## 2020-08-24 NOTE — Telephone Encounter (Signed)
Patient calling to check status of ECHO results   

## 2020-08-31 NOTE — Telephone Encounter (Signed)
Pt notified of test results

## 2020-09-07 ENCOUNTER — Encounter: Payer: Self-pay | Admitting: Cardiology

## 2020-09-07 ENCOUNTER — Other Ambulatory Visit: Payer: Self-pay

## 2020-09-07 ENCOUNTER — Ambulatory Visit: Payer: BC Managed Care – PPO | Admitting: Cardiology

## 2020-09-07 VITALS — BP 122/72 | HR 87 | Ht 65.0 in | Wt 166.0 lb

## 2020-09-07 DIAGNOSIS — R002 Palpitations: Secondary | ICD-10-CM

## 2020-09-07 NOTE — Progress Notes (Signed)
Electrophysiology Office Follow up Visit Note:    Date:  09/07/2020   ID:  Sydney Banks, DOB 03/27/1996, MRN [id number]  PCP:  Erasmo Downer, MD  [facility] Cardiologist:  Debbe Odea, MD  [facility] Electrophysiologist:  Lanier Prude, MD    Interval History:    Sydney Banks is a 25 y.o. female who presents for a follow up visit.  I last saw her in clinic July 27, 2020 for palpitations.  After the last appointment we repeated her heart monitor with her preventice.  She is also had an echocardiogram.  She tells me that she continues to have elevated heart rates with minimal exertion.   Past Medical History:  Diagnosis Date  . Anxiety   . Panic attacks     Past Surgical History:  Procedure Laterality Date  . NO PAST SURGERIES      Current Medications: No outpatient medications have been marked as taking for the 09/07/20 encounter (Office Visit) with Lanier Prude, MD.     Allergies:   Patient has no known allergies.   Social History   Socioeconomic History  . Marital status: Single    Spouse name: Not on file  . Number of children: 0  . Years of education: Not on file  . Highest education level: Not on file  Occupational History  . Occupation: Midwife  Tobacco Use  . Smoking status: Never Smoker  . Smokeless tobacco: Never Used  Vaping Use  . Vaping Use: Never used  Substance and Sexual Activity  . Alcohol use: No  . Drug use: No  . Sexual activity: Not Currently  Other Topics Concern  . Not on file  Social History Narrative  . Not on file   Social Determinants of Health   Financial Resource Strain: Not on file  Food Insecurity: Not on file  Transportation Needs: Not on file  Physical Activity: Not on file  Stress: Not on file  Social Connections: Not on file     Family History: The patient's family history includes Down syndrome in her sibling; Gallbladder disease in her father; Hypertension in her father;  Lung cancer in her paternal grandfather; Thyroid cancer in her maternal uncle. There is no history of Breast cancer or Colon cancer.  ROS:   Please see the history of present illness.    All other systems reviewed and are negative.  EKGs/Labs/Other Studies Reviewed:    The following studies were reviewed today:  September 02, 2020 preventice monitor personally reviewed Heart rate 46-1 40, average 76 bpm Rare ectopy No sustained arrhythmias Patient triggered episodes correspond to sinus rhythm.  August 23, 2020 echo personally reviewed Left ventricular function normal, 60% Right ventricular function normal No significant valvular abnormalities      Recent Labs: 06/09/2020: ALT 12; BUN 8; Creatinine, Ser 0.74; Hemoglobin 14.3; Platelets 324; Potassium 4.2; Sodium 140; TSH 1.060  Recent Lipid Panel No results found for: CHOL, TRIG, HDL, CHOLHDL, VLDL, LDLCALC, LDLDIRECT  Physical Exam:    VS:  BP 122/72   Pulse 87   Ht 5\' 5"  (1.651 m)   Wt 166 lb (75.3 kg)   SpO2 98%   BMI 27.62 kg/m     Wt Readings from Last 3 Encounters:  09/07/20 166 lb (75.3 kg)  07/27/20 160 lb 12.8 oz (72.9 kg)  06/17/20 162 lb (73.5 kg)     GEN:  Well nourished, well developed in no acute distress HEENT: Normal NECK: No JVD; No  carotid bruits LYMPHATICS: No lymphadenopathy CARDIAC: RRR, no murmurs, rubs, gallops RESPIRATORY:  Clear to auscultation without rales, wheezing or rhonchi  ABDOMEN: Soft, non-tender, non-distended MUSCULOSKELETAL:  No edema; No deformity  SKIN: Warm and dry NEUROLOGIC:  Alert and oriented x 3 PSYCHIATRIC:  Normal affect      ASSESSMENT:    1. Palpitations    PLAN:    In order of problems listed above:  1. Palpitations Patient has a structurally normal heart by echo.  Holter monitor has shown normal heart rate variability with no sustained arrhythmias.  Her average heart rate is appropriate.  There are times when I suspect she does have a slightly higher than  average resting heart rate but not significantly elevated.  Patient triggered episodes for symptoms on her heart monitor corresponded to sinus rhythm.  I spent a great deal of time during today's visit going over each page of her heart monitor with the patient.  She does not fit the criteria for POTS or inappropriate sinus tachycardia.  For symptoms I have suggested that we avoid pharmacologic therapy given the likelihood that the therapy may create more off target effects that would be poorly tolerated.  I also worry that by blunting her heart rate response she may feel worse than she does now.  I have recommended that she stay physically active.  She should participate in aerobic exercise at least 30 to 45 minutes/day at least 5 days/week.  She can incorporate an element of recumbent exercise.  I have asked her to stay hydrated throughout the day.  Also discussed the link between anxiety and palpitations.  I offered a referral to [name], PsyD and included his office contact information in her discharge paperwork.  Total time spent with patient today 30 minutes. This includes reviewing records, evaluating the patient and coordinating care.   Medication Adjustments/Labs and Tests Ordered: Current medicines are reviewed at length with the patient today.  Concerns regarding medicines are outlined above.  No orders of the defined types were placed in this encounter.  No orders of the defined types were placed in this encounter.    Signed, Steffanie Dunn, MD, [facility], [facility] 09/07/2020 4:41 PM    Electrophysiology [facility]

## 2020-09-07 NOTE — Patient Instructions (Signed)
Medication Instructions:  Your physician recommends that you continue on your current medications as directed. Please refer to the Current Medication list given to you today.  *If you need a refill on your cardiac medications before your next appointment, please call your pharmacy*   Lab Work: None ordered   Testing/Procedures: None ordered   Follow-Up: At [facility], you and your health needs are our priority.  As part of our continuing mission to provide you with exceptional heart care, we have created designated Provider Care Teams.  These Care Teams include your primary Cardiologist (physician) and Advanced Practice Providers (APPs -  Physician Assistants and Nurse Practitioners) who all work together to provide you with the care you need, when you need it.  Your next appointment:    as needed  The format for your next appointment:   In Person  Provider:   Steffanie Dunn, MD    Thank you for choosing [facility]!!     Other Instructions   Hilbert Corrigan, PsyD   [phone]

## 2020-11-28 ENCOUNTER — Encounter: Payer: Self-pay | Admitting: Family Medicine
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
## 2021-03-07 NOTE — Progress Notes (Signed)
Electrophysiology Office Follow up Visit Note:    Date:  03/08/2021   ID:  Sydney Banks, DOB 28-Sep-1995, MRN [id number]  PCP:  Erasmo Downer, MD  [facility] Cardiologist:  Debbe Odea, MD  [facility] Electrophysiologist:  Lanier Prude, MD    Interval History:    Sydney Banks is a 25 y.o. female who presents for a follow up visit. They were last seen in clinic 09/07/2020. At that appointment I referred to Sydney Mohs, PsyD and reviewed heart monitor results.  She has not followed up with Sydney Banks.  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  She thinks this is helped a lot with her anxiety.  She is concerned that she continues to have episodes of palpitations.  She tells me that her heart rates will increase into the 100s with minimal exertion and stay there for a few minutes before gradually coming down.  No syncope or presyncope.  She tells me she is staying adequately hydrated.  She stopped running because of concerns of heart rates going into the 170s and 180s.  She is to be a frequent runner.     Past Medical History:  Diagnosis Date   Anxiety    Panic attacks     Past Surgical History:  Procedure Laterality Date   NO PAST SURGERIES      Current Medications: No outpatient medications have been marked as taking for the 03/08/21 encounter (Office Visit) with Lanier Prude, MD.     Allergies:   Patient has no known allergies.   Social History   Socioeconomic History   Marital status: Single    Spouse name: Not on file   Number of children: 0   Years of education: Not on file   Highest education level: Not on file  Occupational History   Occupation: kindergarten teacher  Tobacco Use   Smoking status: Never   Smokeless tobacco: Never  Vaping Use   Vaping Use: Never used  Substance and Sexual Activity   Alcohol use: No   Drug use: No   Sexual activity: Not Currently   Other Topics Concern   Not on file  Social History Narrative   Not on file   Social Determinants of Health   Financial Resource Strain: Not on file  Food Insecurity: Not on file  Transportation Needs: Not on file  Physical Activity: Not on file  Stress: Not on file  Social Connections: Not on file     Family History: The patient's family history includes Down syndrome in her sibling; Gallbladder disease in her father; Hypertension in her father; Lung cancer in her paternal grandfather; Thyroid cancer in her maternal uncle. There is no history of Breast cancer or Colon cancer.  ROS:   Please see the history of present illness.    All other systems reviewed and are negative.  EKGs/Labs/Other Studies Reviewed:    The following studies were reviewed today:   EKG:  The ekg ordered today demonstrates sinus rhythm.  Recent Labs: 06/09/2020: ALT 12; BUN 8; Creatinine, Ser 0.74; Hemoglobin 14.3; Platelets 324; Potassium 4.2; Sodium 140; TSH 1.060  Recent Lipid Panel No results found for: CHOL, TRIG, HDL, CHOLHDL, VLDL, LDLCALC, LDLDIRECT  Physical Exam:    VS:  BP 110/78 (BP Location: Left Arm, Patient Position: Sitting, Cuff Size: Normal)   Pulse 68   Ht 5\' 5"  (1.651 m)   Wt 166 lb (75.3  kg)   SpO2 96%   BMI 27.62 kg/m     Wt Readings from Last 3 Encounters:  03/08/21 166 lb (75.3 kg)  09/07/20 166 lb (75.3 kg)  07/27/20 160 lb 12.8 oz (72.9 kg)     GEN:  Well nourished, well developed in no acute distress HEENT: Normal NECK: No JVD; No carotid bruits LYMPHATICS: No lymphadenopathy CARDIAC: RRR, no murmurs, rubs, gallops RESPIRATORY:  Clear to auscultation without rales, wheezing or rhonchi  ABDOMEN: Soft, non-tender, non-distended MUSCULOSKELETAL:  No edema; No deformity  SKIN: Warm and dry NEUROLOGIC:  Alert and oriented x 3 PSYCHIATRIC:  Normal affect        ASSESSMENT:    1. Palpitations   2. GAD (generalized anxiety disorder)    PLAN:    In order  of problems listed above:   #Palpitations Structurally normal heart.  2 prior Holter monitors showed no SVT or sustained arrhythmias.  Heart rate monitors have previously shown normal heart rate variability.  Her history and prior work-up is not suggestive of POTS.  I suspect that she has sinus tachycardia at times which is symptomatic.  I suspect many of her symptoms may be related to her history of anxiety.  I recommended again that she follow-up with Sydney Banks.  I do not think a repeat Holter monitor is indicated given her story is not consistent with SVT.  I do not think medications are indicated at this time.  I think the risks associated with medical therapy are high and may create more problems than any potential small benefit.  This was discussed at length with the patient.  Follow-up 4 to 6 months with an APP.    Total time spent with patient today 22 minutes. This includes reviewing records, evaluating the patient and coordinating care.   Medication Adjustments/Labs and Tests Ordered: Current medicines are reviewed at length with the patient today.  Concerns regarding medicines are outlined above.  Orders Placed This Encounter  Procedures   EKG 12-Lead    No orders of the defined types were placed in this encounter.    Signed, Steffanie Dunn, MD, [facility], [facility] 03/08/2021 9:22 AM    Electrophysiology  [facility]

## 2021-03-08 ENCOUNTER — Other Ambulatory Visit: Payer: Self-pay

## 2021-03-08 ENCOUNTER — Encounter: Payer: Self-pay | Admitting: Cardiology

## 2021-03-08 ENCOUNTER — Ambulatory Visit: Payer: BC Managed Care – PPO | Admitting: Cardiology

## 2021-03-08 VITALS — BP 110/78 | HR 68 | Ht 65.0 in | Wt 166.0 lb

## 2021-03-08 DIAGNOSIS — R002 Palpitations: Secondary | ICD-10-CM

## 2021-03-08 DIAGNOSIS — F411 Generalized anxiety disorder: Secondary | ICD-10-CM

## 2021-03-08 NOTE — Patient Instructions (Addendum)
Medication Instructions:  Your physician recommends that you continue on your current medications as directed. Please refer to the Current Medication list given to you today. *If you need a refill on your cardiac medications before your next appointment, please call your pharmacy*  Lab Work: None ordered. If you have labs (blood work) drawn today and your tests are completely normal, you will receive your results only by: MyChart Message (if you have MyChart) OR A paper copy in the mail If you have any lab test that is abnormal or we need to change your treatment, we will call you to review the results.  Testing/Procedures: None ordered.  Follow-Up: At [facility], you and your health needs are our priority.  As part of our continuing mission to provide you with exceptional heart care, we have created designated Provider Care Teams.  These Care Teams include your primary Cardiologist (physician) and Advanced Practice Providers (APPs -  Physician Assistants and Nurse Practitioners) who all work together to provide you with the care you need, when you need it.  Your next appointment:   Your physician wants you to follow-up in: 4-6 months with one of the following Advanced Practice Providers on your designated Care Team:   Nicolasa Ducking, NP Eula Listen, PA-C Marisue Ivan, PA-C Cadence Fransico Michael, PA-C You will receive a reminder letter in the mail two months in advance. If you don't receive a letter, please call our office to schedule the follow-up appointment.

## 2021-03-28 ENCOUNTER — Ambulatory Visit: Payer: Self-pay

## 2021-03-28 ENCOUNTER — Telehealth: Payer: BC Managed Care – PPO | Admitting: Physician Assistant

## 2021-03-28 DIAGNOSIS — J111 Influenza due to unidentified influenza virus with other respiratory manifestations: Secondary | ICD-10-CM

## 2021-03-28 MED ORDER — OSELTAMIVIR PHOSPHATE 75 MG PO CAPS: 75.0000 mg | ORAL_CAPSULE | Freq: Two times a day (BID) | ORAL | 0 refills | Status: DC

## 2021-03-28 NOTE — Telephone Encounter (Signed)
Patient called in to try and get an appointment think she may have the flu has sinus drainage, sore throat and fatigue. Needing some instructions please call Ph# [phone]   Pt. 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 States she started having symptoms last night. Sinus pain, sore throat, fatigue. No availability in the practice. Pt. Will do virtual visit through [email or web address].     Reason for Disposition  [1] Sinus pain (not just congestion) AND [2] fever  Answer Assessment - Initial Assessment Questions 1. LOCATION: "Where does it hurt?"      Nose 2. ONSET: "When did the sinus pain start?"  (e.g., hours, days)      Last night 3. SEVERITY: "How bad is the pain?"   (Scale 1-10; mild, moderate or severe)   - MILD (1-3): doesn't interfere with normal activities    - MODERATE (4-7): interferes with normal activities (e.g., work or school) or awakens from sleep   - SEVERE (8-10): excruciating pain and patient unable to do any normal activities        Moderate 4. RECURRENT SYMPTOM: "Have you ever had sinus problems before?" If Yes, ask: "When was the last time?" and "What happened that time?"      Yes 5. NASAL CONGESTION: "Is the nose blocked?" If Yes, ask: "Can you open it or must you breathe through your mouth?"     Blocked 6. NASAL DISCHARGE: "Do you have discharge from your nose?" If so ask, "What color?"     None 7. FEVER: "Do you have a fever?" If Yes, ask: "What is it, how was it measured, and when did it start?"      No 8. OTHER SYMPTOMS: "Do you have any other symptoms?" (e.g., sore throat, cough, earache, difficulty breathing)     Sore throat, fatigue 9. PREGNANCY: "Is there any chance you are pregnant?" "When was your last menstrual period?"     No  Protocols used: Sinus Pain or Congestion-A-AH

## 2021-03-28 NOTE — Progress Notes (Signed)
Virtual Visit Consent   Sydney Banks, you are scheduled for a virtual visit with a [facility] provider today.     Just as with appointments in the office, your consent must be obtained to participate.  Your consent will be active for this visit and any virtual visit you may have with one of our providers in the next 365 days.     If you have a MyChart account, a copy of this consent can be sent to you electronically.  All virtual visits are billed to your insurance company just like a traditional visit in the office.    As this is a virtual visit, video technology does not allow for your provider to perform a traditional examination.  This may limit your provider's ability to fully assess your condition.  If your provider identifies any concerns that need to be evaluated in person or the need to arrange testing (such as labs, EKG, etc.), we will make arrangements to do so.     Although advances in technology are sophisticated, we cannot ensure that it will always work on either your end or our end.  If the connection with a video visit is poor, the visit may have to be switched to a telephone visit.  With either a video or telephone visit, we are not always able to ensure that we have a secure connection.     I need to obtain your verbal consent now.   Are you willing to proceed with your visit today?    Sydney Banks has provided verbal consent on 03/28/2021 for a virtual visit (video or telephone).   Margaretann Loveless, PA-C   Date: 03/28/2021 10:09 AM   Virtual Visit via Video Note   I, Margaretann Loveless, connected with  Sydney Banks  ([id number], [birth year]) on 03/28/21 at 10:15 AM EST by a video-enabled telemedicine application and verified that I am speaking with the correct person using two identifiers.  Location: Patient: Virtual Visit Location Patient: Home Provider: Virtual Visit Location Provider: Home Office   I discussed the limitations of evaluation and management by  telemedicine and the availability of in person appointments. The patient expressed understanding and agreed to proceed.    History of Present Illness: Sydney Banks is a 25 y.o. who identifies as a female who was assigned female at birth, and is being seen today for flu-like illness.  HPI: Influenza This is a new problem. The current episode started yesterday (10 am last night). The problem occurs constantly. The problem has been gradually worsening. Associated symptoms include chills, congestion, fatigue, a fever and a sore throat. Pertinent negatives include no coughing, nausea or vomiting. Associated symptoms comments: Post nasal drainage. Nothing aggravates the symptoms. She has tried acetaminophen, rest and sleep for the symptoms. The treatment provided mild relief.    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  Problems:  Patient Active Problem List   Diagnosis Date Noted   Palpitations 06/09/2020   GAD (generalized anxiety disorder) 06/09/2020    Allergies: No Known Allergies Medications:  Current Outpatient Medications:    oseltamivir (TAMIFLU) 75 MG capsule, Take 1 capsule (75 mg total) by mouth 2 (two) times daily., Disp: 10 capsule, Rfl: 0  Observations/Objective: Patient is well-developed, well-nourished in no acute distress.  Resting comfortably at home.  Head is normocephalic, atraumatic.  No labored breathing.  Speech is clear and coherent with logical content.  Patient is alert and oriented at baseline.    Assessment and Plan: 1. Influenza - oseltamivir (TAMIFLU) 75 MG capsule; Take 1 capsule (75 mg total) by mouth 2 (two) times daily.  Dispense: 10 capsule; Refill: 0  - Suspect flu due to symptoms and exposure - Tamiflu prescribed - May continue symptomatic management of choice OTC - Push fluids - Rest - Seek in person evaluation if not improving or worsening  Follow Up Instructions: I discussed the  assessment and treatment plan with the patient. The patient was provided an opportunity to ask questions and all were answered. The patient agreed with the plan and demonstrated an understanding of the instructions.  A copy of instructions were sent to the patient via MyChart unless otherwise noted below.   The patient was advised to call back or seek an in-person evaluation if the symptoms worsen or if the condition fails to improve as anticipated.  Time:  I spent 10 minutes with the patient via telehealth technology discussing the above problems/concerns.    Margaretann Loveless, PA-C

## 2021-03-28 NOTE — Patient Instructions (Signed)
Sydney Banks, thank you for joining Margaretann Loveless, PA-C for today's virtual visit.  While this provider is not your primary care provider (PCP), if your PCP is located in our provider database this encounter information will be shared with them immediately following your visit.  Consent: (Patient) Sydney Banks provided verbal consent for this virtual visit at the beginning of the encounter.  Current Medications:  Current Outpatient Medications:    oseltamivir (TAMIFLU) 75 MG capsule, Take 1 capsule (75 mg total) by mouth 2 (two) times daily., Disp: 10 capsule, Rfl: 0   Medications ordered in this encounter:  Meds ordered this encounter  Medications   oseltamivir (TAMIFLU) 75 MG capsule    Sig: Take 1 capsule (75 mg total) by mouth 2 (two) times daily.    Dispense:  10 capsule    Refill:  0    Order Specific Question:   Supervising Provider    Answer:   Hyacinth Meeker, BRIAN [[id number]]     *If you need refills on other medications prior to your next appointment, please contact your pharmacy*  Follow-Up: Call back or seek an in-person evaluation if the symptoms worsen or if the condition fails to improve as anticipated.  Other Instructions Influenza, Adult Influenza, also called "the flu," is a viral infection that mainly affects the respiratory tract. This includes the lungs, nose, and throat. The flu spreads easily from person to person (is contagious). It causes common cold symptoms, along with high fever and body aches. What are the causes? This condition is caused by the influenza virus. You can get the virus by: Breathing in droplets that are in the air from an infected person's cough or sneeze. Touching something that has the virus on it (has been contaminated) and then touching your mouth, nose, or eyes. What increases the risk? The following factors may make you more likely to get the flu: Not washing or sanitizing your hands often. Having close contact with many people  during cold and flu season. Touching your mouth, eyes, or nose without first washing or sanitizing your hands. Not getting an annual flu shot. You may have a higher risk for the flu, including serious problems, such as a lung infection (pneumonia), if you: Are older than 65. Are pregnant. Have a weakened disease-fighting system (immune system). This includes people who have HIV or AIDS, are on chemotherapy, or are taking medicines that reduce (suppress) the immune system. Have a long-term (chronic) illness, such as heart disease, kidney disease, diabetes, or lung disease. Have a liver disorder. Are severely overweight (morbidly obese). Have anemia. Have asthma. What are the signs or symptoms? Symptoms of this condition usually begin suddenly and last 4-14 days. These may include: Fever and chills. Headaches, body aches, or muscle aches. Sore throat. Cough. Runny or stuffy (congested) nose. Chest discomfort. Poor appetite. Weakness or fatigue. Dizziness. Nausea or vomiting. How is this diagnosed? This condition may be diagnosed based on: Your symptoms and medical history. A physical exam. Swabbing your nose or throat and testing the fluid for the influenza virus. How is this treated? If the flu is diagnosed early, you can be treated with antiviral medicine that is given by mouth (orally) or through an IV. This can help reduce how severe the illness is and how long it lasts. Taking care of yourself at home can help relieve symptoms. Your health care provider may recommend: Taking over-the-counter medicines. Drinking plenty of fluids. In many cases, the flu goes away on its own.  If you have severe symptoms or complications, you may be treated in a hospital. Follow these instructions at home: Activity Rest as needed and get plenty of sleep. Stay home from work or school as told by your health care provider. Unless you are visiting your health care provider, avoid leaving home until  your fever has been gone for 24 hours without taking medicine. Eating and drinking Take an oral rehydration solution (ORS). This is a drink that is sold at pharmacies and retail stores. Drink enough fluid to keep your urine pale yellow. Drink clear fluids in small amounts as you are able. Clear fluids include water, ice chips, fruit juice mixed with water, and low-calorie sports drinks. Eat bland, easy-to-digest foods in small amounts as you are able. These foods include bananas, applesauce, rice, lean meats, toast, and crackers. Avoid drinking fluids that contain a lot of sugar or caffeine, such as energy drinks, regular sports drinks, and soda. Avoid alcohol. Avoid spicy or fatty foods. General instructions   Take over-the-counter and prescription medicines only as told by your health care provider. Use a cool mist humidifier to add humidity to the air in your home. This can make it easier to breathe. When using a cool mist humidifier, clean it daily. Empty the water and replace it with clean water. Cover your mouth and nose when you cough or sneeze. Wash your hands with soap and water often and for at least 20 seconds, especially after you cough or sneeze. If soap and water are not available, use alcohol-based hand sanitizer. Keep all follow-up visits. This is important. How is this prevented?  Get an annual flu shot. This is usually available in late summer, fall, or winter. Ask your health care provider when you should get your flu shot. Avoid contact with people who are sick during cold and flu season. This is generally fall and winter. Contact a health care provider if: You develop new symptoms. You have: Chest pain. Diarrhea. A fever. Your cough gets worse. You produce more mucus. You feel nauseous or you vomit. Get help right away if you: Develop shortness of breath or have difficulty breathing. Have skin or nails that turn a bluish color. Have severe pain or stiffness in  your neck. Develop a sudden headache or sudden pain in your face or ear. Cannot eat or drink without vomiting. These symptoms may represent a serious problem that is an emergency. Do not wait to see if the symptoms will go away. Get medical help right away. Call your local emergency services (911 in the [place] Do not drive yourself to the hospital. Summary Influenza, also called "the flu," is a viral infection that primarily affects your respiratory tract. Symptoms of the flu usually begin suddenly and last 4-14 days. Getting an annual flu shot is the best way to prevent getting the flu. Stay home from work or school as told by your health care provider. Unless you are visiting your health care provider, avoid leaving home until your fever has been gone for 24 hours without taking medicine. Keep all follow-up visits. This is important. This information is not intended to replace advice given to you by your health care provider. Make sure you discuss any questions you have with your health care provider. Document Revised: 12/11/2019 Document Reviewed: 12/11/2019 Elsevier Patient Education  2022 [employer].    If you have been instructed to have an in-person evaluation today at a local Urgent Care facility, please use the link below. It will  take you to a list of all of our available [facility], including address, phone number and hours of operation. Please do not delay care.  [facility]  If you or a family member do not have a primary care provider, use the link below to schedule a visit and establish care. When you choose a [facility] primary care physician or advanced practice provider, you gain a long-term partner in health. Find a Primary Care Provider  Learn more about [facility]'s in-office and virtual care options:  - Get Care Now

## 2021-04-18 ENCOUNTER — Telehealth: Payer: BC Managed Care – PPO

## 2021-05-28 ENCOUNTER — Telehealth: Payer: BC Managed Care – PPO | Admitting: Nurse Practitioner

## 2021-05-28 DIAGNOSIS — J028 Acute pharyngitis due to other specified organisms: Secondary | ICD-10-CM | POA: Diagnosis not present

## 2021-05-28 DIAGNOSIS — B9689 Other specified bacterial agents as the cause of diseases classified elsewhere: Secondary | ICD-10-CM | POA: Diagnosis not present

## 2021-05-28 MED ORDER — AZITHROMYCIN 250 MG PO TABS: ORAL_TABLET | ORAL | 0 refills | Status: AC

## 2021-05-28 NOTE — Progress Notes (Signed)
Virtual Visit Consent   [name], you are scheduled for a virtual visit with a [facility] provider today.     Just as with appointments in the office, your consent must be obtained to participate.  Your consent will be active for this visit and any virtual visit you may have with one of our providers in the next 365 days.     If you have a MyChart account, a copy of this consent can be sent to you electronically.  All virtual visits are billed to your insurance company just like a traditional visit in the office.    As this is a virtual visit, video technology does not allow for your provider to perform a traditional examination.  This may limit your provider's ability to fully assess your condition.  If your provider identifies any concerns that need to be evaluated in person or the need to arrange testing (such as labs, EKG, etc.), we will make arrangements to do so.     Although advances in technology are sophisticated, we cannot ensure that it will always work on either your end or our end.  If the connection with a video visit is poor, the visit may have to be switched to a telephone visit.  With either a video or telephone visit, we are not always able to ensure that we have a secure connection.     I need to obtain your verbal consent now.   Are you willing to proceed with your visit today?    Sydney Banks has provided verbal consent on 05/28/2021 for a virtual visit (video or telephone).   Claiborne Rigg, NP   Date: 05/28/2021 10:59 AM   Virtual Visit via Video Note   I, Claiborne Rigg, connected with  Sydney Banks  ([id number], December 01, 1995) on 05/28/21 at 11:00 AM EST by a video-enabled telemedicine application and verified that I am speaking with the correct person using two identifiers.  Location: Patient: Virtual Visit Location Patient: Home Provider: Virtual Visit Location Provider: Home Office   I discussed the limitations of evaluation and management by telemedicine  and the availability of in person appointments. The patient expressed understanding and agreed to proceed.    History of Present Illness: Sydney Banks is a 26 y.o. who identifies as a female who was assigned female at birth, and is being seen today for bacterial pharyngitis.  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 She now has low grade fever, lymphadenopathy, vocal hoarseness, white spots in back of throat, and very sore throat.   Problems:  Patient Active Problem List   Diagnosis Date Noted   Palpitations 06/09/2020   GAD (generalized anxiety disorder) 06/09/2020    Allergies: No Known Allergies Medications:  Current Outpatient Medications:    azithromycin (ZITHROMAX) 250 MG tablet, Take 2 tablets on day 1, then 1 tablet daily on days 2 through 5, Disp: 6 tablet, Rfl: 0   oseltamivir (TAMIFLU) 75 MG capsule, Take 1 capsule (75 mg total) by mouth 2 (two) times daily., Disp: 10 capsule, Rfl: 0  Observations/Objective: Patient is well-developed, well-nourished in no acute distress.  Resting comfortably at home.  Head is normocephalic, atraumatic.  No labored breathing.  Speech is clear and coherent with logical content.  Patient is alert and oriented at baseline.    Assessment and Plan: 1. Acute bacterial pharyngitis - azithromycin (ZITHROMAX) 250 MG tablet;  Take 2 tablets on day 1, then 1 tablet daily on days 2 through 5  Dispense: 6 tablet; Refill: 0 May take OTC NSAIDs for pain relief  Follow Up Instructions: I discussed the assessment and treatment plan with the patient. The patient was provided an opportunity to ask questions and all were answered. The patient agreed with the plan and demonstrated an understanding of the instructions.  A copy of instructions were sent to the patient via MyChart unless otherwise noted below.     The patient was advised to call back or seek an in-person  evaluation if the symptoms worsen or if the condition fails to improve as anticipated.  Time:  I spent 12 minutes with the patient via telehealth technology discussing the above problems/concerns.    Claiborne Rigg, NP

## 2021-05-28 NOTE — Patient Instructions (Signed)
°  Sydney Banks, thank you for joining Claiborne Rigg, NP for today's virtual visit.  While this provider is not your primary care provider (PCP), if your PCP is located in our provider database this encounter information will be shared with them immediately following your visit.  Consent: (Patient) Sydney Banks provided verbal consent for this virtual visit at the beginning of the encounter.  Current Medications:  Current Outpatient Medications:    azithromycin (ZITHROMAX) 250 MG tablet, Take 2 tablets on day 1, then 1 tablet daily on days 2 through 5, Disp: 6 tablet, Rfl: 0   oseltamivir (TAMIFLU) 75 MG capsule, Take 1 capsule (75 mg total) by mouth 2 (two) times daily., Disp: 10 capsule, Rfl: 0   Medications ordered in this encounter:  Meds ordered this encounter  Medications   azithromycin (ZITHROMAX) 250 MG tablet    Sig: Take 2 tablets on day 1, then 1 tablet daily on days 2 through 5    Dispense:  6 tablet    Refill:  0    Order Specific Question:   Supervising Provider    Answer:   Eber Hong [[id number]]     *If you need refills on other medications prior to your next appointment, please contact your pharmacy*  Follow-Up: Call back or seek an in-person evaluation if the symptoms worsen or if the condition fails to improve as anticipated.    If you have been instructed to have an in-person evaluation today at a local Urgent Care facility, please use the link below. It will take you to a list of all of our available [facility], including address, phone number and hours of operation. Please do not delay care.  [facility]  If you or a family member do not have a primary care provider, use the link below to schedule a visit and establish care. When you choose a [facility] primary care physician or advanced practice provider, you gain a long-term partner in health. Find a Primary Care Provider  Learn more about [facility]'s in-office and virtual care  options: Will - Get Care Now

## 2021-07-12 ENCOUNTER — Encounter: Payer: Self-pay | Admitting: Family Medicine

## 2021-07-12 DIAGNOSIS — F411 Generalized anxiety disorder: Secondary | ICD-10-CM

## 2021-08-21 ENCOUNTER — Telehealth: Payer: BC Managed Care – PPO | Admitting: Physician Assistant

## 2021-08-21 DIAGNOSIS — L559 Sunburn, unspecified: Secondary | ICD-10-CM | POA: Diagnosis not present

## 2021-08-21 MED ORDER — TRIAMCINOLONE ACETONIDE 0.1 % EX CREA: 1.0000 "application " | TOPICAL_CREAM | Freq: Two times a day (BID) | CUTANEOUS | 0 refills | Status: DC

## 2021-08-21 MED ORDER — IBUPROFEN 600 MG PO TABS: 600.0000 mg | ORAL_TABLET | Freq: Three times a day (TID) | ORAL | 0 refills | Status: DC | PRN
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
## 2021-08-21 NOTE — Patient Instructions (Signed)
?Sydney Banks, thank you for joining Margaretann Loveless, PA-C for today's virtual visit.  While this provider is not your primary care provider (PCP), if your PCP is located in our provider database this encounter information will be shared with them immediately following your visit. ? ?Consent: ?(Patient) Sydney Banks provided verbal consent for this virtual visit at the beginning of the encounter. ? ?Current Medications: ? ?Current Outpatient Medications:  ?  ibuprofen (ADVIL) 600 MG tablet, Take 1 tablet (600 mg total) by mouth every 8 (eight) hours as needed., Disp: 30 tablet, Rfl: 0 ?  triamcinolone cream (KENALOG) 0.1 %, Apply 1 application. topically 2 (two) times daily., Disp: 80 g, Rfl: 0 ?  oseltamivir (TAMIFLU) 75 MG capsule, Take 1 capsule (75 mg total) by mouth 2 (two) times daily., Disp: 10 capsule, Rfl: 0  ? ?Medications ordered in this encounter:  ?Meds ordered this encounter  ?Medications  ? ibuprofen (ADVIL) 600 MG tablet  ?  Sig: Take 1 tablet (600 mg total) by mouth every 8 (eight) hours as needed.  ?  Dispense:  30 tablet  ?  Refill:  0  ?  Order Specific Question:   Supervising Provider  ?  Answer:   Eber Hong [[id number]]  ? triamcinolone cream (KENALOG) 0.1 %  ?  Sig: Apply 1 application. topically 2 (two) times daily.  ?  Dispense:  80 g  ?  Refill:  0  ?  Order Specific Question:   Supervising Provider  ?  Answer:   Eber Hong [[id number]]  ?  ? ?*If you need refills on other medications prior to your next appointment, please contact your pharmacy* ? ?Follow-Up: ?Call back or seek an in-person evaluation if the symptoms worsen or if the condition fails to improve as anticipated. ? ?Other Instructions ?Sunburn, Adult ? ?Sunburn is damage to the skin that is caused by too much exposure to ultraviolet (UV) rays. Repeated, prolonged sun exposure causes signs of early skin aging, such as wrinkles and sun spots. It also increases the risk of skin cancer. ?What are the causes? ?Sunburn is caused by  getting too much UV radiation from the sun, sunlamps, or tanning beds. ?What increases the risk? ?The following factors may make you more likely to develop this condition: ?Having light-colored skin (fair complexion), skin with many freckles or moles, or skin that tends to burn instead of tan. ?Having fair or red hair. ?Having blue or green eyes. ?Other factors include: ?Living in an area with strong sun exposure. ?Having a family history of sensitivity to the sun or a family history of skin cancer. ?Having a body defense system (immune system) that does not work properly because of certain diseases (such as lupus) or certain drugs. ?Taking certain medicines that cause you to be sensitive to sunlight (have photosensitivity). ?What are the signs or symptoms? ?Symptoms of this condition include: ?Red or pink skin. ?Soreness and swelling of the skin in the affected areas. ?Pain. ?Blisters. ?Peeling skin. ?If the sunburn is severe, you may also have a headache, fever, nausea, dizziness, or fatigue. ?How is this diagnosed? ?This condition is diagnosed with a medical history and physical exam. ?How is this treated? ?Mild or moderate sunburns can often be managed with self-care strategies, including: ?Cool baths or cool, wet cloths (cool compresses). ?Moisturizer or aloe for pain relief. ?Over-the-counter pain relievers. ?Drinking extra water to replace lost fluids and to prevent dehydration. ?A severe sunburn may require: ?Antibiotic medicines if there is an associated  infection. ?IV fluids. ?Follow these instructions at home: ?Medicines ?Take or apply over-the-counter and prescription medicines only as told by your health care provider. ?If you were prescribed an antibiotic medicine, use it as told by your health care provider. Do not stop using the antibiotic even if your condition improves. ?General instructions ?Avoid further exposure to the sun. Protect sunburned skin by wearing clothing that covers the injured  skin. ?Do not put ice on your sunburn. This can cause further damage. Try taking a cool bath or applying a cool compress to your skin. This may help with pain. ?Drink enough fluid to keep your urine pale yellow. ?Try applying aloe vera or a moisturizer that has soy in it to your sunburn. This may help. Do not apply aloe vera or moisturizer with soy if your sunburn has blisters. ?Do not break any blisters that you may have. ?Keep all follow-up visits. This is important. ?How is this prevented? ? ?Try to avoid the sun between 10 a.m. and 4 p.m. The sun is strongest during those hours. ?Apply sunscreen 15-30 minutes before you will be out in the sun. ?Apply a sunscreen with an SPF of 30 or higher. Consider using an SPF of 30 or higher if you will be exposed to the sun for prolonged periods of time. Use a sunscreen that protects against all of the sun's rays (broad-spectrum) and is water-resistant. ?Reapply sunscreen: ?About every 2 hours during sun exposure. ?More often when sweating a lot while out in the sun. ?After getting wet from swimming or playing in water. ?When you are outside, wear long sleeves, a hat, and sunglasses that block UV light. ?Talk with your health care provider about medicines, herbs, and foods that can make you more sensitive to light. Avoid these, if possible. ?Do not use tanning beds. ?Contact a health care provider if: ?You have a fever or chills. ?Your symptoms do not improve with treatment. ?Your pain is not controlled with medicine. ?Your burn becomes more painful or swollen. ?You develop open blisters. ?Get help right away if: ?You are dizzy or you pass out. ?You have a severe headache or you feel confused. ?You vomit or have diarrhea. ?You develop severe blistering. ?You have pus or fluid coming from the blisters. ?These symptoms may represent a serious problem that is an emergency. Do not wait to see if the symptoms will go away. Get medical help right away. Call your local emergency  services (911 in the [place] Do not drive yourself to the hospital. ?Summary ?Sunburn is caused by getting too much ultraviolet (UV) radiation from the sun, sunlamps, or tanning beds. ?People with light-colored skin (fair complexion) have an increased risk of sunburn. ?Mild or moderate sunburns can often be managed with self-care strategies, including cool baths or cool cloths (compresses). ?To help prevent sunburn, apply sunscreen 15-30 minutes or more before you will be exposed to the sun. ?This information is not intended to replace advice given to you by your health care provider. Make sure you discuss any questions you have with your health care provider. ?Document Revised: 07/27/2020 Document Reviewed: 07/27/2020 ?Elsevier Patient Education ? 2023 [employer] ? ? ? ?If you have been instructed to have an in-person evaluation today at a local Urgent Care facility, please use the link below. It will take you to a list of all of our available [facility], including address, phone number and hours of operation. Please do not delay care.  ?[facility] ? ?If  you or a family member do not have a primary care provider, use the link below to schedule a visit and establish care. When you choose a [facility] primary care physician or advanced practice provider, you gain a long-term partner in health. ?Find a Primary Care Provider ? ?Learn more about [facility] in-office and virtual care options: ?[facility] Get Care Now ?

## 2021-08-23 ENCOUNTER — Encounter: Payer: Self-pay | Admitting: Cardiology

## 2021-08-23 ENCOUNTER — Ambulatory Visit: Payer: BC Managed Care – PPO | Admitting: Cardiology

## 2021-08-23 VITALS — BP 128/80 | HR 71 | Ht 65.0 in | Wt 176.0 lb

## 2021-08-23 DIAGNOSIS — R002 Palpitations: Secondary | ICD-10-CM | POA: Diagnosis not present

## 2021-08-23 NOTE — Progress Notes (Signed)
?Electrophysiology Office Follow up Visit Note:   ? ?Date:  08/23/2021  ? ?ID:  Sydney Banks, DOB [birth year], MRN [id number] ? ?PCP:  Erasmo Downer, MD  ?[facility] Cardiologist:  Debbe Odea, MD  ?[facility] Electrophysiologist:  Lanier Prude, MD  ? ? ?Interval History:   ? ?Sydney Banks is a 26 y.o. female who presents for a follow up visit. They were last seen in clinic March 08, 2021.  I have seen her for palpitations.  Based on her history, anxiety plays a key role in her symptoms.  She is warm to prior Holter monitors which showed no evidence of sustained arrhythmia.  Her work-up and history is not suggestive of POTS.  I have referred to Dr. Bosie Clos in the past. ?She is doing overall about the same.  She still feels palpitations at times especially when she lays flat.  These have not changed since I last saw her.  No changes to her medications. ? ? ? ?  ? ?Past Medical History:  ?Diagnosis Date  ? Anxiety   ? Panic attacks   ? ? ?Past Surgical History:  ?Procedure Laterality Date  ? NO PAST SURGERIES    ? ? ?Current Medications: ?Current Meds  ?Medication Sig  ? ibuprofen (ADVIL) 600 MG tablet Take 1 tablet (600 mg total) by mouth every 8 (eight) hours as needed.  ? triamcinolone cream (KENALOG) 0.1 % Apply 1 application. topically 2 (two) times daily.  ?  ? ?Allergies:   Patient has no known allergies.  ? ?Social History  ? ?Socioeconomic History  ? Marital status: Single  ?  Spouse name: Not on file  ? Number of children: 0  ? Years of education: Not on file  ? Highest education level: Not on file  ?Occupational History  ? Occupation: Midwife  ?Tobacco Use  ? Smoking status: Never  ? Smokeless tobacco: Never  ?Vaping Use  ? Vaping Use: Never used  ?Substance and Sexual Activity  ? Alcohol use: No  ? Drug use: No  ? Sexual activity: Not Currently  ?Other Topics Concern  ? Not on file  ?Social History Narrative  ? Not on file  ? ?Social Determinants of Health   ? ?Financial Resource Strain: Not on file  ?Food Insecurity: Not on file  ?Transportation Needs: Not on file  ?Physical Activity: Not on file  ?Stress: Not on file  ?Social Connections: Not on file  ?  ? ?Family History: ?The patient's family history includes Down syndrome in her sibling; Gallbladder disease in her father; Hypertension in her father; Lung cancer in her paternal grandfather; Thyroid cancer in her maternal uncle. There is no history of Breast cancer or Colon cancer. ? ?ROS:   ?Please see the history of present illness.    ?All other systems reviewed and are negative. ? ?EKGs/Labs/Other Studies Reviewed:   ? ?The following studies were reviewed today: ? ? ?EKG:  The ekg ordered today demonstrates sinus rhythm.  No preexcitation.  Heart rate 71 bpm. ? ?Recent Labs: ?No results found for requested labs within last 8760 hours.  ?Recent Lipid Panel ?No results found for: CHOL, TRIG, HDL, CHOLHDL, VLDL, LDLCALC, LDLDIRECT ? ?Physical Exam:   ? ?VS:  BP 128/80 (BP Location: Left Arm, Patient Position: Sitting, Cuff Size: Normal)   Pulse 71   Ht 5\' 5"  (1.651 m)   Wt 176 lb (79.8 kg)   SpO2 98%   BMI 29.29 kg/m?    ? ?  Wt Readings from Last 3 Encounters:  ?08/23/21 176 lb (79.8 kg)  ?03/08/21 166 lb (75.3 kg)  ?09/07/20 166 lb (75.3 kg)  ?  ? ?GEN:  Well nourished, well developed in no acute distress ?HEENT: Normal ?NECK: No JVD; No carotid bruits ?LYMPHATICS: No lymphadenopathy ?CARDIAC: RRR, no murmurs, rubs, gallops ?RESPIRATORY:  Clear to auscultation without rales, wheezing or rhonchi  ?ABDOMEN: Soft, non-tender, non-distended ?MUSCULOSKELETAL:  No edema; No deformity  ?SKIN: Warm and dry.   ?NEUROLOGIC:  Alert and oriented x 3 ?PSYCHIATRIC:  Normal affect  ? ? ? ?  ? ?ASSESSMENT:   ? ?No diagnosis found. ?PLAN:   ? ?In order of problems listed above: ? ?#Palpitations ?No changes since I last saw her.  I suspect much of her symptoms are related to sinus rhythm and some sinus tachycardia.  Many of her  symptoms I believe are secondary to significant anxiety.  No additional testing is indicated at this time.  I would not recommend any medical therapy at this time.  I have encouraged her to stay hydrated and remain aerobically active.  She will follow up with Dr. Beryle Flock on a routine basis.  I will see her on an as-needed basis. ? ? ?Medication Adjustments/Labs and Tests Ordered: ?Current medicines are reviewed at length with the patient today.  Concerns regarding medicines are outlined above.  ?No orders of the defined types were placed in this encounter. ? ?No orders of the defined types were placed in this encounter. ? ? ? ?Signed, ?Steffanie Dunn, MD, [facility], [facility] ?08/23/2021 3:11 PM    ?Electrophysiology ?[facility] ?

## 2021-08-23 NOTE — Patient Instructions (Addendum)
Medications: °Your physician recommends that you continue on your current medications as directed. Please refer to the Current Medication list given to you today. °*If you need a refill on your cardiac medications before your next appointment, please call your pharmacy* ° °Lab Work: °None. °If you have labs (blood work) drawn today and your tests are completely normal, you will receive your results only by: °MyChart Message (if you have MyChart) OR °A paper copy in the mail °If you have any lab test that is abnormal or we need to change your treatment, we will call you to review the results. ° °Testing/Procedures: °None. ° °Follow-Up: °At [facility], you and your health needs are our priority.  As part of our continuing mission to provide you with exceptional heart care, we have created designated Provider Care Teams.  These Care Teams include your primary Cardiologist (physician) and Advanced Practice Providers (APPs -  Physician Assistants and Nurse Practitioners) who all work together to provide you with the care you need, when you need it. ° °Your physician wants you to follow-up in: As needed  with Sydney Banks  ° °We recommend signing up for the patient portal called "MyChart".  Sign up information is provided on this After Visit Summary.  MyChart is used to connect with patients for Virtual Visits (Telemedicine).  Patients are able to view lab/test results, encounter notes, upcoming appointments, etc.  Non-urgent messages can be sent to your provider as well.   °To learn more about what you can do with MyChart, go to [email or web address]://[email or web address].com.   ° °Any Other Special Instructions Will Be Listed Below (If Applicable).  °

## 2021-09-17 ENCOUNTER — Telehealth: Payer: BC Managed Care – PPO | Admitting: Family

## 2021-09-17 DIAGNOSIS — J029 Acute pharyngitis, unspecified: Secondary | ICD-10-CM

## 2021-09-17 DIAGNOSIS — Z20818 Contact with and (suspected) exposure to other bacterial communicable diseases: Secondary | ICD-10-CM | POA: Diagnosis not present

## 2021-09-17 MED ORDER — AMOXICILLIN 500 MG PO CAPS: 500.0000 mg | ORAL_CAPSULE | Freq: Two times a day (BID) | ORAL | 0 refills | Status: AC

## 2021-09-17 NOTE — Progress Notes (Signed)

## 2021-10-08 ENCOUNTER — Telehealth: Payer: BC Managed Care – PPO | Admitting: Nurse Practitioner

## 2021-10-08 DIAGNOSIS — J020 Streptococcal pharyngitis: Secondary | ICD-10-CM | POA: Diagnosis not present

## 2021-10-08 MED ORDER — PENICILLIN V POTASSIUM 500 MG PO TABS: 500.0000 mg | ORAL_TABLET | Freq: Two times a day (BID) | ORAL | 0 refills | Status: AC

## 2021-10-08 NOTE — Progress Notes (Signed)

## 2021-10-10 ENCOUNTER — Ambulatory Visit: Payer: BC Managed Care – PPO

## 2022-04-16 ENCOUNTER — Telehealth: Payer: BC Managed Care – PPO | Admitting: Family Medicine

## 2022-04-16 DIAGNOSIS — J069 Acute upper respiratory infection, unspecified: Secondary | ICD-10-CM

## 2022-04-16 MED ORDER — BENZONATATE 100 MG PO CAPS: 200.0000 mg | ORAL_CAPSULE | Freq: Two times a day (BID) | ORAL | 0 refills | Status: DC | PRN

## 2022-04-16 MED ORDER — FLUTICASONE PROPIONATE 50 MCG/ACT NA SUSP: 2.0000 | Freq: Every day | NASAL | 0 refills | Status: DC

## 2022-04-16 MED ORDER — PSEUDOEPH-BROMPHEN-DM 30-2-10 MG/5ML PO SYRP: 5.0000 mL | ORAL_SOLUTION | Freq: Four times a day (QID) | ORAL | 0 refills | Status: DC | PRN

## 2022-04-16 NOTE — Progress Notes (Signed)

## 2022-04-20 ENCOUNTER — Telehealth: Payer: BC Managed Care – PPO | Admitting: Family Medicine

## 2022-04-20 DIAGNOSIS — J40 Bronchitis, not specified as acute or chronic: Secondary | ICD-10-CM

## 2022-04-20 DIAGNOSIS — J069 Acute upper respiratory infection, unspecified: Secondary | ICD-10-CM

## 2022-04-20 MED ORDER — ALBUTEROL SULFATE HFA 108 (90 BASE) MCG/ACT IN AERS
2.0000 | INHALATION_SPRAY | Freq: Four times a day (QID) | RESPIRATORY_TRACT | 0 refills | Status: DC | PRN
Start: 2022-04-20 — End: 2022-11-12

## 2022-04-20 MED ORDER — PREDNISONE 20 MG PO TABS: 20.0000 mg | ORAL_TABLET | Freq: Two times a day (BID) | ORAL | 0 refills | Status: AC

## 2022-04-20 MED ORDER — BENZONATATE 100 MG PO CAPS: 200.0000 mg | ORAL_CAPSULE | Freq: Two times a day (BID) | ORAL | 0 refills | Status: DC | PRN

## 2022-04-20 NOTE — Progress Notes (Signed)
Virtual Visit Consent   Sydney Banks, you are scheduled for a virtual visit with a [facility] provider today. Just as with appointments in the office, your consent must be obtained to participate. Your consent will be active for this visit and any virtual visit you may have with one of our providers in the next 365 days. If you have a MyChart account, a copy of this consent can be sent to you electronically.  As this is a virtual visit, video technology does not allow for your provider to perform a traditional examination. This may limit your provider's ability to fully assess your condition. If your provider identifies any concerns that need to be evaluated in person or the need to arrange testing (such as labs, EKG, etc.), we will make arrangements to do so. Although advances in technology are sophisticated, we cannot ensure that it will always work on either your end or our end. If the connection with a video visit is poor, the visit may have to be switched to a telephone visit. With either a video or telephone visit, we are not always able to ensure that we have a secure connection.  By engaging in this virtual visit, you consent to the provision of healthcare and authorize for your insurance to be billed (if applicable) for the services provided during this visit. Depending on your insurance coverage, you may receive a charge related to this service.  I need to obtain your verbal consent now. Are you willing to proceed with your visit today? Sydney Banks has provided verbal consent on 04/20/2022 for a virtual visit (video or telephone). Georgana Curio, FNP  Date: 04/20/2022 11:04 AM  Virtual Visit via Video Note   I, Georgana Curio, connected with  Sydney Banks  ([id number], [birth year]) on 04/20/22 at 11:00 AM EST by a video-enabled telemedicine application and verified that I am speaking with the correct person using two identifiers.  Location: Patient: Virtual Visit Location Patient: Other:  work Provider: Pharmacist, community: Home Office   I discussed the limitations of evaluation and management by telemedicine and the availability of in person appointments. The patient expressed understanding and agreed to proceed.    History of Present Illness: Sydney Banks is a 26 y.o. who identifies as a female who was assigned female at birth, and is being seen today for cough, tightness in chest like when she has asthmatic  bronchitis. She requests tessalon and albuterol. No history of asthma.No fever, no chest pain, no distress. Sx for 6 days worsening.   HPI: HPI  Problems:  Patient Active Problem List   Diagnosis Date Noted   Palpitations 06/09/2020   GAD (generalized anxiety disorder) 06/09/2020    Allergies: No Known Allergies Medications:  Current Outpatient Medications:    albuterol (VENTOLIN HFA) 108 (90 Base) MCG/ACT inhaler, Inhale 2 puffs into the lungs every 6 (six) hours as needed for wheezing or shortness of breath., Disp: 8 g, Rfl: 0   predniSONE (DELTASONE) 20 MG tablet, Take 1 tablet (20 mg total) by mouth 2 (two) times daily with a meal for 5 days., Disp: 10 tablet, Rfl: 0   benzonatate (TESSALON) 100 MG capsule, Take 2 capsules (200 mg total) by mouth 2 (two) times daily as needed for cough., Disp: 20 capsule, Rfl: 0   brompheniramine-pseudoephedrine-DM 30-2-10 MG/5ML syrup, Take 5 mLs by mouth 4 (four) times daily as needed., Disp: 120 mL, Rfl: 0   fluticasone (FLONASE) 50 MCG/ACT nasal spray, Place 2  sprays into both nostrils daily., Disp: 16 g, Rfl: 0   ibuprofen (ADVIL) 600 MG tablet, Take 1 tablet (600 mg total) by mouth every 8 (eight) hours as needed., Disp: 30 tablet, Rfl: 0   triamcinolone cream (KENALOG) 0.1 %, Apply 1 application. topically 2 (two) times daily., Disp: 80 g, Rfl: 0  Observations/Objective: Patient is well-developed, well-nourished in no acute distress.  Standing outside at work  Head is normocephalic, atraumatic.  No labored  breathing.  Speech is clear and coherent with logical content.  Patient is alert and oriented at baseline.    Assessment and Plan: 1. Bronchitis  2. Viral URI with cough - benzonatate (TESSALON) 100 MG capsule; Take 2 capsules (200 mg total) by mouth 2 (two) times daily as needed for cough.  Dispense: 20 capsule; Refill: 0  Increase fluids, recheck as needed, humidifier at night, urgent care if sx worsen.   Follow Up Instructions: I discussed the assessment and treatment plan with the patient. The patient was provided an opportunity to ask questions and all were answered. The patient agreed with the plan and demonstrated an understanding of the instructions.  A copy of instructions were sent to the patient via MyChart unless otherwise noted below.     The patient was advised to call back or seek an in-person evaluation if the symptoms worsen or if the condition fails to improve as anticipated.  Time:  I spent 8 minutes with the patient via telehealth technology discussing the above problems/concerns.    Georgana Curio, FNP

## 2022-04-20 NOTE — Patient Instructions (Signed)

## 2022-05-18 ENCOUNTER — Telehealth: Payer: BC Managed Care – PPO | Admitting: Physician Assistant

## 2022-05-18 ENCOUNTER — Encounter: Payer: Self-pay | Admitting: Physician Assistant

## 2022-05-18 DIAGNOSIS — J020 Streptococcal pharyngitis: Secondary | ICD-10-CM | POA: Diagnosis not present

## 2022-05-18 MED ORDER — PENICILLIN V POTASSIUM 500 MG PO TABS: 500.0000 mg | ORAL_TABLET | Freq: Three times a day (TID) | ORAL | 0 refills | Status: AC

## 2022-05-18 NOTE — Progress Notes (Unsigned)

## 2022-08-26 ENCOUNTER — Telehealth: Payer: BC Managed Care – PPO | Admitting: Family Medicine

## 2022-08-26 DIAGNOSIS — J020 Streptococcal pharyngitis: Secondary | ICD-10-CM

## 2022-08-26 MED ORDER — AMOXICILLIN 500 MG PO CAPS: 500.0000 mg | ORAL_CAPSULE | Freq: Two times a day (BID) | ORAL | 0 refills | Status: DC

## 2022-08-26 NOTE — Progress Notes (Signed)

## 2022-08-29 ENCOUNTER — Telehealth: Payer: BC Managed Care – PPO | Admitting: Physician Assistant

## 2022-08-29 DIAGNOSIS — J208 Acute bronchitis due to other specified organisms: Secondary | ICD-10-CM | POA: Diagnosis not present

## 2022-08-29 DIAGNOSIS — B9689 Other specified bacterial agents as the cause of diseases classified elsewhere: Secondary | ICD-10-CM | POA: Diagnosis not present

## 2022-08-29 MED ORDER — PROMETHAZINE-DM 6.25-15 MG/5ML PO SYRP
5.0000 mL | ORAL_SOLUTION | Freq: Four times a day (QID) | ORAL | 0 refills | Status: DC | PRN
Start: 2022-08-29 — End: 2022-11-12

## 2022-08-29 MED ORDER — AZITHROMYCIN 250 MG PO TABS
ORAL_TABLET | ORAL | 0 refills | Status: AC
Start: 2022-08-29 — End: 2022-09-03

## 2022-08-29 NOTE — Patient Instructions (Signed)
Sydney Banks, thank you for joining Margaretann Loveless, PA-C for today's virtual visit.  While this provider is not your primary care provider (PCP), if your PCP is located in our provider database this encounter information will be shared with them immediately following your visit.   A [facility] MyChart account gives you access to today's visit and all your visits, tests, and labs performed at [facility] " click here if you don't have a [facility] MyChart account or go to [email or web address]  Consent: (Patient) Sydney Banks provided verbal consent for this virtual visit at the beginning of the encounter.  Current Medications:  Current Outpatient Medications:    azithromycin (ZITHROMAX) 250 MG tablet, Take 2 tablets on day 1, then 1 tablet daily on days 2 through 5, Disp: 6 tablet, Rfl: 0   promethazine-dextromethorphan (PROMETHAZINE-DM) 6.25-15 MG/5ML syrup, Take 5 mLs by mouth 4 (four) times daily as needed., Disp: 118 mL, Rfl: 0   albuterol (VENTOLIN HFA) 108 (90 Base) MCG/ACT inhaler, Inhale 2 puffs into the lungs every 6 (six) hours as needed for wheezing or shortness of breath., Disp: 8 g, Rfl: 0   benzonatate (TESSALON) 100 MG capsule, Take 2 capsules (200 mg total) by mouth 2 (two) times daily as needed for cough., Disp: 20 capsule, Rfl: 0   fluticasone (FLONASE) 50 MCG/ACT nasal spray, Place 2 sprays into both nostrils daily., Disp: 16 g, Rfl: 0   ibuprofen (ADVIL) 600 MG tablet, Take 1 tablet (600 mg total) by mouth every 8 (eight) hours as needed., Disp: 30 tablet, Rfl: 0   triamcinolone cream (KENALOG) 0.1 %, Apply 1 application. topically 2 (two) times daily., Disp: 80 g, Rfl: 0   Medications ordered in this encounter:  Meds ordered this encounter  Medications   azithromycin (ZITHROMAX) 250 MG tablet    Sig: Take 2 tablets on day 1, then 1 tablet daily on days 2 through 5    Dispense:  6 tablet    Refill:  0    Order Specific Question:   Supervising  Provider    Answer:   Merrilee Jansky [[id number]]   promethazine-dextromethorphan (PROMETHAZINE-DM) 6.25-15 MG/5ML syrup    Sig: Take 5 mLs by mouth 4 (four) times daily as needed.    Dispense:  118 mL    Refill:  0    Order Specific Question:   Supervising Provider    Answer:   Merrilee Jansky [[id number]]     *If you need refills on other medications prior to your next appointment, please contact your pharmacy*  Follow-Up: Call back or seek an in-person evaluation if the symptoms worsen or if the condition fails to improve as anticipated.  [facility] [phone]  Other Instructions  Acute Bronchitis, Adult  Acute bronchitis is sudden inflammation of the main airways (bronchi) that come off the windpipe (trachea) in the lungs. The swelling causes the airways to get smaller and make more mucus than normal. This can make it hard to breathe and can cause coughing or noisy breathing (wheezing). Acute bronchitis may last several weeks. The cough may last longer. Allergies, asthma, and exposure to smoke may make the condition worse. What are the causes? This condition can be caused by germs and by substances that irritate the lungs, including: Cold and flu viruses. The most common cause of this condition is the virus that causes the common cold. Bacteria. This is less common. Breathing in substances that irritate the lungs, including: Smoke from  cigarettes and other forms of tobacco. Dust and pollen. Fumes from household cleaning products, gases, or burned fuel. Indoor or outdoor air pollution. What increases the risk? The following factors may make you more likely to develop this condition: A weak body's defense system, also called the immune system. A condition that affects your lungs and breathing, such as asthma. What are the signs or symptoms? Common symptoms of this condition include: Coughing. This may bring up clear, yellow, or green mucus from your lungs  (sputum). Wheezing. Runny or stuffy nose. Having too much mucus in your lungs (chest congestion). Shortness of breath. Aches and pains, including sore throat or chest. How is this diagnosed? This condition is usually diagnosed based on: Your symptoms and medical history. A physical exam. You may also have other tests, including tests to rule out other conditions, such as pneumonia. These tests include: A test of lung function. Test of a mucus sample to look for the presence of bacteria. Tests to check the oxygen level in your blood. Blood tests. Chest X-ray. How is this treated? Most cases of acute bronchitis clear up over time without treatment. Your health care provider may recommend: Drinking more fluids to help thin your mucus so it is easier to cough up. Taking inhaled medicine (inhaler) to improve air flow in and out of your lungs. Using a vaporizer or a humidifier. These are machines that add water to the air to help you breathe better. Taking a medicine that thins mucus and clears congestion (expectorant). Taking a medicine that prevents or stops coughing (cough suppressant). It is not common to take an antibiotic medicine for this condition. Follow these instructions at home:  Take over-the-counter and prescription medicines only as told by your health care provider. Use an inhaler, vaporizer, or humidifier as told by your health care provider. Take two teaspoons (10 mL) of honey at bedtime to lessen coughing at night. Drink enough fluid to keep your urine pale yellow. Do not use any products that contain nicotine or tobacco. These products include cigarettes, chewing tobacco, and vaping devices, such as e-cigarettes. If you need help quitting, ask your health care provider. Get plenty of rest. Return to your normal activities as told by your health care provider. Ask your health care provider what activities are safe for you. Keep all follow-up visits. This is  important. How is this prevented? To lower your risk of getting this condition again: Wash your hands often with soap and water for at least 20 seconds. If soap and water are not available, use hand sanitizer. Avoid contact with people who have cold symptoms. Try not to touch your mouth, nose, or eyes with your hands. Avoid breathing in smoke or chemical fumes. Breathing smoke or chemical fumes will make your condition worse. Get the flu shot every year. Contact a health care provider if: Your symptoms do not improve after 2 weeks. You have trouble coughing up the mucus. Your cough keeps you awake at night. You have a fever. Get help right away if you: Cough up blood. Feel pain in your chest. Have severe shortness of breath. Faint or keep feeling like you are going to faint. Have a severe headache. Have a fever or chills that get worse. These symptoms may represent a serious problem that is an emergency. Do not wait to see if the symptoms will go away. Get medical help right away. Call your local emergency services (911 in the [place] Do not drive yourself to the hospital. Summary  Acute bronchitis is inflammation of the main airways (bronchi) that come off the windpipe (trachea) in the lungs. The swelling causes the airways to get smaller and make more mucus than normal. Drinking more fluids can help thin your mucus so it is easier to cough up. Take over-the-counter and prescription medicines only as told by your health care provider. Do not use any products that contain nicotine or tobacco. These products include cigarettes, chewing tobacco, and vaping devices, such as e-cigarettes. If you need help quitting, ask your health care provider. Contact a health care provider if your symptoms do not improve after 2 weeks. This information is not intended to replace advice given to you by your health care provider. Make sure you discuss any questions you have with your health care  provider. Document Revised: 08/03/2021 Document Reviewed: 08/24/2020 Elsevier Patient Education  2023 [employer].    If you have been instructed to have an in-person evaluation today at a local Urgent Care facility, please use the link below. It will take you to a list of all of our available [facility], including address, phone number and hours of operation. Please do not delay care.  [facility]  If you or a family member do not have a primary care provider, use the link below to schedule a visit and establish care. When you choose a [facility] primary care physician or advanced practice provider, you gain a long-term partner in health. Find a Primary Care Provider  Learn more about [facility]'s in-office and virtual care options: [facility] - Get Care Now

## 2022-08-29 NOTE — Progress Notes (Signed)
Virtual Visit Consent   Sydney Banks, you are scheduled for a virtual visit with a [facility] provider today. Just as with appointments in the office, your consent must be obtained to participate. Your consent will be active for this visit and any virtual visit you may have with one of our providers in the next 365 days. If you have a MyChart account, a copy of this consent can be sent to you electronically.  As this is a virtual visit, video technology does not allow for your provider to perform a traditional examination. This may limit your provider's ability to fully assess your condition. If your provider identifies any concerns that need to be evaluated in person or the need to arrange testing (such as labs, EKG, etc.), we will make arrangements to do so. Although advances in technology are sophisticated, we cannot ensure that it will always work on either your end or our end. If the connection with a video visit is poor, the visit may have to be switched to a telephone visit. With either a video or telephone visit, we are not always able to ensure that we have a secure connection.  By engaging in this virtual visit, you consent to the provision of healthcare and authorize for your insurance to be billed (if applicable) for the services provided during this visit. Depending on your insurance coverage, you may receive a charge related to this service.  I need to obtain your verbal consent now. Are you willing to proceed with your visit today? [name] has provided verbal consent on 08/29/2022 for a virtual visit (video or telephone). Margaretann Loveless, PA-C  Date: 08/29/2022 8:39 AM  Virtual Visit via Video Note   I, Margaretann Loveless, connected with  Sydney Banks  ([id number], June 14, 1995) on 08/29/22 at  8:30 AM EDT by a video-enabled telemedicine application and verified that I am speaking with the correct person using two identifiers.  Location: Patient: Virtual Visit Location  Patient: Home Provider: Virtual Visit Location Provider: Home Office   I discussed the limitations of evaluation and management by telemedicine and the availability of in person appointments. The patient expressed understanding and agreed to proceed.    History of Present Illness: Sydney Banks is a 27 y.o. who identifies as a female who was assigned female at birth, and is being seen today for cough. Completed an E-visit questionnaire on 08/26/22 and was started on Amoxicillin for possible strep throat. Cough has continued to worsen. Stopped Amoxicillin after one day [facility].   HPI: Cough This is a new problem. The current episode started in the past 7 days (Started last wednesday, then worsened on Saturday. Sunday had a fever start; EV for strep done on Sunday. Then symptoms continued to worsen so she went to [facility]). The problem has been gradually worsening. The problem occurs every few minutes. The cough is Productive of sputum and productive of purulent sputum. Associated symptoms include chest pain (tightness), chills, a fever, headaches, myalgias, nasal congestion, postnasal drip, rhinorrhea, a sore throat, shortness of breath, sweats and wheezing. Pertinent negatives include no ear pain. The symptoms are aggravated by lying down. She has tried a beta-agonist inhaler, prescription cough suppressant and rest (tylenol, one day of amoxicillin) for the symptoms. The treatment provided mild relief. Her past medical history is significant for bronchitis and environmental allergies. There is no history of asthma or pneumonia.      Problems:  Patient Active Problem List   Diagnosis Date Noted   Palpitations 06/09/2020   GAD (generalized anxiety disorder) 06/09/2020    Allergies: No Known Allergies Medications:  Current Outpatient Medications:    albuterol (VENTOLIN HFA) 108 (90 Base) MCG/ACT inhaler, Inhale 2 puffs into the lungs every 6 (six)  hours as needed for wheezing or shortness of breath., Disp: 8 g, Rfl: 0   amoxicillin (AMOXIL) 500 MG capsule, Take 1 capsule (500 mg total) by mouth 2 (two) times daily for 10 days., Disp: 20 capsule, Rfl: 0   azithromycin (ZITHROMAX) 250 MG tablet, Take 2 tablets on day 1, then 1 tablet daily on days 2 through 5, Disp: 6 tablet, Rfl: 0   benzonatate (TESSALON) 100 MG capsule, Take 2 capsules (200 mg total) by mouth 2 (two) times daily as needed for cough., Disp: 20 capsule, Rfl: 0   fluticasone (FLONASE) 50 MCG/ACT nasal spray, Place 2 sprays into both nostrils daily., Disp: 16 g, Rfl: 0   ibuprofen (ADVIL) 600 MG tablet, Take 1 tablet (600 mg total) by mouth every 8 (eight) hours as needed., Disp: 30 tablet, Rfl: 0   promethazine-dextromethorphan (PROMETHAZINE-DM) 6.25-15 MG/5ML syrup, Take 5 mLs by mouth 4 (four) times daily as needed., Disp: 118 mL, Rfl: 0   triamcinolone cream (KENALOG) 0.1 %, Apply 1 application. topically 2 (two) times daily., Disp: 80 g, Rfl: 0  Observations/Objective: Patient is well-developed, well-nourished in no acute distress.  Resting comfortably  at home.  Head is normocephalic, atraumatic.  No labored breathing.  Speech is clear and coherent with logical content.  Patient is alert and oriented at baseline.    Assessment and Plan: 1. Acute bacterial bronchitis - azithromycin (ZITHROMAX) 250 MG tablet; Take 2 tablets on day 1, then 1 tablet daily on days 2 through 5  Dispense: 6 tablet; Refill: 0 - promethazine-dextromethorphan (PROMETHAZINE-DM) 6.25-15 MG/5ML syrup; Take 5 mLs by mouth 4 (four) times daily as needed.  Dispense: 118 mL; Refill: 0  - Worsening over a week despite OTC medications - Will treat with Z-pack and Promethazine DM - Can add Mucinex  - Tylenol as needed - Albuterol inhaler use 1 puff every 6-8 hours standing dose for next 3-5 days - Push fluids.  - Rest.  - Steam and humidifier can help - Seek in person evaluation if worsening or  symptoms fail to improve    Follow Up Instructions: I discussed the assessment and treatment plan with the patient. The patient was provided an opportunity to ask questions and all were answered. The patient agreed with the plan and demonstrated an understanding of the instructions.  A copy of instructions were sent to the patient via MyChart unless otherwise noted below.     The patient was advised to call back or seek an in-person evaluation if the symptoms worsen or if the condition fails to improve as anticipated.  Time:  I spent 10 minutes with the patient via telehealth technology discussing the above problems/concerns.    Margaretann Loveless, PA-C

## 2022-11-02 ENCOUNTER — Encounter: Payer: Self-pay | Admitting: Family Medicine

## 2022-11-12 ENCOUNTER — Encounter: Payer: Self-pay | Admitting: Family Medicine

## 2022-11-12 ENCOUNTER — Ambulatory Visit: Payer: BC Managed Care – PPO | Admitting: Family Medicine

## 2022-11-12 VITALS — BP 109/75 | HR 76 | Temp 97.8°F | Resp 12 | Ht 64.0 in | Wt 181.4 lb

## 2022-11-12 DIAGNOSIS — F411 Generalized anxiety disorder: Secondary | ICD-10-CM

## 2022-11-12 DIAGNOSIS — Z13220 Encounter for screening for lipoid disorders: Secondary | ICD-10-CM

## 2022-11-12 DIAGNOSIS — Z23 Encounter for immunization: Secondary | ICD-10-CM

## 2022-11-12 DIAGNOSIS — Z0000 Encounter for general adult medical examination without abnormal findings: Secondary | ICD-10-CM

## 2022-11-12 MED ORDER — HYDROXYZINE PAMOATE 25 MG PO CAPS: 25.0000 mg | ORAL_CAPSULE | Freq: Three times a day (TID) | ORAL | 2 refills | Status: DC | PRN

## 2022-11-12 NOTE — Progress Notes (Signed)
I,Sulibeya S Dimas,acting as a Neurosurgeon for Shirlee Latch, MD.,have documented all relevant documentation on the behalf of Shirlee Latch, MD,as directed by  Shirlee Latch, MD while in the presence of Shirlee Latch, MD.   Complete physical exam   Patient: Sydney Banks   DOB: 31-Dec-1995   27 y.o. Female  MRN: [id number] Visit Date: 11/12/2022  Today's healthcare provider: Shirlee Latch, MD   Chief Complaint  Patient presents with   Annual Exam   Subjective    Sydney Banks is a 27 y.o. female who presents today for a complete physical exam.  She reports consuming a general diet. Home exercise routine includes walking 3.5 hrs per week. She generally feels well. She reports sleeping well. She does have additional problems to discuss today.  HPI   Discussed the use of AI scribe software for clinical note transcription with the patient, who gave verbal consent to proceed.  History of Present Illness   The patient, a Runner, broadcasting/film/video, presents for a physical and to discuss anxiety. She reports experiencing heightened anxiety, particularly in social situations, which has led to panic attacks. The patient describes an incident three weeks ago where she experienced severe chest pain following a family trip, which she attributes to built-up anxiety. The patient acknowledges that she is an anxious person and expresses a desire to address this issue. She has previously been prescribed Paxil and Xanax for anxiety but did not take them. 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  In addition to anxiety, the patient has never had a Pap smear due to fear and anxiety around the procedure. She has also never received the HPV vaccine. The patient expresses a desire to start with a less potent anxiety medication and is open to considering therapy.          11/12/2022   10:55 AM  GAD 7 : Generalized Anxiety Score  Nervous, Anxious, on Edge 2   Control/stop worrying 1  Worry too much - different things 1  Trouble relaxing 0  Restless 0  Easily annoyed or irritable 1  Afraid - awful might happen 0  Total GAD 7 Score 5  Anxiety Difficulty Not difficult at all      Past Medical History:  Diagnosis Date   Anxiety    Panic attacks    Past Surgical History:  Procedure Laterality Date   NO PAST SURGERIES     Social History   Socioeconomic History   Marital status: Single    Spouse name: Not on file   Number of children: 0   Years of education: Not on file   Highest education level: Not on file  Occupational History   Occupation: Midwife  Tobacco Use   Smoking status: Never   Smokeless tobacco: Never  Vaping Use   Vaping Use: Never used  Substance and Sexual Activity   Alcohol use: No   Drug use: No   Sexual activity: Not Currently  Other Topics Concern   Not on file  Social History Narrative   Not on file   Social Determinants of Health   Financial Resource Strain: Not on file  Food Insecurity: Not on file  Transportation Needs: Not on file  Physical Activity: Not on file  Stress: Not on file  Social Connections: Not on file  Intimate Partner Violence: Not on file   Family Status  Relation Name Status   Mother  Alive   Father  Alive  Sib  (Not Specified)   PGF  (Not Specified)   Mat Uncle  (Not Specified)   Neg Hx  (Not Specified)   Family History  Problem Relation Age of Onset   Gallbladder disease Father    Hypertension Father    Down syndrome Sibling    Lung cancer Paternal Grandfather    Thyroid cancer Maternal Uncle    Breast cancer Neg Hx    Colon cancer Neg Hx    No Known Allergies  Patient Care Team: Osiris Odriscoll, Marzella Schlein, MD as PCP - General (Family Medicine) Debbe Odea, MD as PCP - Cardiology (Cardiology) Lanier Prude, MD as PCP - Electrophysiology (Cardiology)   Medications: Outpatient Medications Prior to Visit  Medication Sig    [DISCONTINUED] albuterol (VENTOLIN HFA) 108 (90 Base) MCG/ACT inhaler Inhale 2 puffs into the lungs every 6 (six) hours as needed for wheezing or shortness of breath.   [DISCONTINUED] benzonatate (TESSALON) 100 MG capsule Take 2 capsules (200 mg total) by mouth 2 (two) times daily as needed for cough.   [DISCONTINUED] fluticasone (FLONASE) 50 MCG/ACT nasal spray Place 2 sprays into both nostrils daily.   [DISCONTINUED] ibuprofen (ADVIL) 600 MG tablet Take 1 tablet (600 mg total) by mouth every 8 (eight) hours as needed.   [DISCONTINUED] promethazine-dextromethorphan (PROMETHAZINE-DM) 6.25-15 MG/5ML syrup Take 5 mLs by mouth 4 (four) times daily as needed.   [DISCONTINUED] triamcinolone cream (KENALOG) 0.1 % Apply 1 application. topically 2 (two) times daily.   No facility-administered medications prior to visit.    Review of Systems    Objective    BP 109/75 (BP Location: Left Arm, Patient Position: Sitting, Cuff Size: Large)   Pulse 76   Temp 97.8 F (36.6 C) (Temporal)   Resp 12   Ht 5\' 4"  (1.626 m)   Wt 181 lb 6.4 oz (82.3 kg)   LMP 11/12/2022 (Exact Date)   BMI 31.14 kg/m     Physical Exam Vitals reviewed.  Constitutional:      General: She is not in acute distress.    Appearance: Normal appearance. She is well-developed. She is not diaphoretic.  HENT:     Head: Normocephalic and atraumatic.     Right Ear: Tympanic membrane, ear canal and external ear normal.     Left Ear: Tympanic membrane, ear canal and external ear normal.     Nose: Nose normal.     Mouth/Throat:     Mouth: Mucous membranes are moist.     Pharynx: Oropharynx is clear. No oropharyngeal exudate.  Eyes:     General: No scleral icterus.    Conjunctiva/sclera: Conjunctivae normal.     Pupils: Pupils are equal, round, and reactive to light.  Neck:     Thyroid: No thyromegaly.  Cardiovascular:     Rate and Rhythm: Normal rate and regular rhythm.     Pulses: Normal pulses.     Heart sounds: Normal  heart sounds. No murmur heard. Pulmonary:     Effort: Pulmonary effort is normal. No respiratory distress.     Breath sounds: Normal breath sounds. No wheezing or rales.  Abdominal:     General: There is no distension.     Palpations: Abdomen is soft.     Tenderness: There is no abdominal tenderness.  Musculoskeletal:        General: No deformity.     Cervical back: Neck supple.     Right lower leg: No edema.     Left lower leg: No edema.  Lymphadenopathy:     Cervical: No cervical adenopathy.  Skin:    General: Skin is warm and dry.     Findings: No rash.  Neurological:     Mental Status: She is alert and oriented to person, place, and time. Mental status is at baseline.     Gait: Gait normal.  Psychiatric:        Mood and Affect: Mood is anxious. Affect is tearful.        Behavior: Behavior normal.        Thought Content: Thought content normal.      Last depression screening scores    11/12/2022   10:54 AM 06/09/2020    2:28 PM  PHQ 2/9 Scores  PHQ - 2 Score 0 0  PHQ- 9 Score 2 2   Last fall risk screening    11/12/2022   10:54 AM  Fall Risk   Falls in the past year? 0  Number falls in past yr: 0  Injury with Fall? 0  Risk for fall due to : No Fall Risks  Follow up Falls evaluation completed   Last Audit-C alcohol use screening    06/09/2020    2:29 PM  Alcohol Use Disorder Test (AUDIT)  1. How often do you have a drink containing alcohol? 0  2. How many drinks containing alcohol do you have on a typical day when you are drinking? 0  3. How often do you have six or more drinks on one occasion? 0  AUDIT-C Score 0  Alcohol Brief Interventions/Follow-up AUDIT Score <7 follow-up not indicated   A score of 3 or more in women, and 4 or more in men indicates increased risk for alcohol abuse, EXCEPT if all of the points are from question 1   No results found for any visits on 11/12/22.  Assessment & Plan    Routine Health Maintenance and Physical Exam  Exercise  Activities and Dietary recommendations  Goals   None     Immunization History  Administered Date(s) Administered   PFIZER(Purple Top)SARS-COV-2 Vaccination 01/22/2020, 02/12/2020   Tdap 11/12/2022    Health Maintenance  Topic Date Due   PAP-Cervical Cytology Screening  Never done   PAP SMEAR-Modifier  Never done   COVID-19 Vaccine (3 - 2023-24 season) 01/05/2022   INFLUENZA VACCINE  12/06/2022   DTaP/Tdap/Td (2 - Td or Tdap) 11/11/2032   Hepatitis C Screening  Completed   HIV Screening  Completed   HPV VACCINES  Aged Out    Discussed health benefits of physical activity, and encouraged her to engage in regular exercise appropriate for her age and condition.  Problem List Items Addressed This Visit       Other   GAD (generalized anxiety disorder)   Relevant Medications   hydrOXYzine (VISTARIL) 25 MG capsule   Other Visit Diagnoses     Encounter for annual physical exam    -  Primary   Relevant Orders   Comprehensive metabolic panel   Lipid panel   CBC   Need for Tdap vaccination       Relevant Orders   Tdap vaccine greater than or equal to 7yo IM (Completed)   Screening for lipid disorders       Relevant Orders   Lipid panel          Anxiety: Recent panic attack with chest pain following a stressful family event. Previously prescribed Paxil and Xanax but never took them. Patient prefers to avoid daily medication at this  time. -Prescribe Hydroxyzine to be taken up to three times a day as needed. -Encourage patient to consider therapy for anxiety management. - declines zoloft at this time  General Health Maintenance: -Order screening blood work including kidney and liver function, blood sugar, cholesterol, and blood counts. -Discussed the importance of Pap smear for cervical cancer screening. Patient declined due to anxiety. Encouraged patient to consider HPV vaccination as a preventive measure.        Return in about 1 year (around 11/12/2023) for CPE.      I, Shirlee Latch, MD, have reviewed all documentation for this visit. The documentation on 11/12/22 for the exam, diagnosis, procedures, and orders are all accurate and complete.   Cipriano Millikan, Marzella Schlein, MD, MPH [facility]

## 2022-11-13 ENCOUNTER — Encounter: Payer: Self-pay | Admitting: Family Medicine

## 2022-11-13 LAB — COMPREHENSIVE METABOLIC PANEL
ALT: 10 IU/L (ref 0–32)
AST: 15 IU/L (ref 0–40)
Albumin: 5 g/dL (ref 4.0–5.0)
Alkaline Phosphatase: 65 IU/L (ref 44–121)
BUN/Creatinine Ratio: 16 (ref 9–23)
BUN: 11 mg/dL (ref 6–20)
Bilirubin Total: 0.4 mg/dL (ref 0.0–1.2)
CO2: 24 mmol/L (ref 20–29)
Calcium: 9.7 mg/dL (ref 8.7–10.2)
Chloride: 101 mmol/L (ref 96–106)
Creatinine, Ser: 0.7 mg/dL (ref 0.57–1.00)
Globulin, Total: 2.9 g/dL (ref 1.5–4.5)
Glucose: 78 mg/dL (ref 70–99)
Potassium: 4.6 mmol/L (ref 3.5–5.2)
Sodium: 140 mmol/L (ref 134–144)
Total Protein: 7.9 g/dL (ref 6.0–8.5)
eGFR: 121 mL/min/{1.73_m2} (ref >59)

## 2022-11-13 LAB — LIPID PANEL
Chol/HDL Ratio: 2.4 ratio (ref 0.0–4.4)
Cholesterol, Total: 128 mg/dL (ref 100–199)
HDL: 53 mg/dL (ref >39)
LDL Chol Calc (NIH): 65 mg/dL (ref 0–99)
Triglycerides: 42 mg/dL (ref 0–149)
VLDL Cholesterol Cal: 10 mg/dL (ref 5–40)

## 2022-11-13 LAB — CBC
Hematocrit: 47 % — ABNORMAL HIGH (ref 34.0–46.6)
Hemoglobin: 15.9 g/dL (ref 11.1–15.9)
MCH: 29.6 pg (ref 26.6–33.0)
MCHC: 33.8 g/dL (ref 31.5–35.7)
MCV: 87 fL (ref 79–97)
Platelets: 320 10*3/uL (ref 150–450)
RBC: 5.38 x10E6/uL — ABNORMAL HIGH (ref 3.77–5.28)
RDW: 12.7 % (ref 11.7–15.4)
WBC: 7.1 10*3/uL (ref 3.4–10.8)

## 2023-02-13 ENCOUNTER — Other Ambulatory Visit: Payer: Self-pay

## 2023-02-13 ENCOUNTER — Encounter: Payer: Self-pay | Admitting: Family Medicine

## 2023-02-14 MED ORDER — HYDROXYZINE PAMOATE 25 MG PO CAPS: 25.0000 mg | ORAL_CAPSULE | Freq: Three times a day (TID) | ORAL | 2 refills | Status: DC | PRN

## 2023-02-26 ENCOUNTER — Telehealth: Payer: BC Managed Care – PPO | Admitting: Emergency Medicine

## 2023-02-26 DIAGNOSIS — J069 Acute upper respiratory infection, unspecified: Secondary | ICD-10-CM | POA: Diagnosis not present

## 2023-02-26 MED ORDER — BENZONATATE 100 MG PO CAPS: 100.0000 mg | ORAL_CAPSULE | Freq: Two times a day (BID) | ORAL | 0 refills | Status: DC | PRN

## 2023-02-26 MED ORDER — AZELASTINE HCL 0.1 % NA SOLN: 2.0000 | Freq: Two times a day (BID) | NASAL | 0 refills | Status: DC

## 2023-02-26 NOTE — Progress Notes (Signed)
E-Visit for Upper Respiratory Infection   We are sorry you are not feeling well.  Here is how we plan to help!  Let me know if you need a note for work.   Based on what you have shared with me, it looks like you may have a viral upper respiratory infection.  Upper respiratory infections are caused by a large number of viruses; however, rhinovirus is the most common cause. COVID is also a possibility and still circulating in our community; I recommend you test for COVID at home.   Symptoms vary from person to person, with common symptoms including sore throat, cough, fatigue or lack of energy and feeling of general discomfort.  A low-grade fever of up to 100.4 may present, but is often uncommon.  Symptoms vary however, and are closely related to a person's age or underlying illnesses.  The most common symptoms associated with an upper respiratory infection are nasal discharge or congestion, cough, sneezing, headache and pressure in the ears and face.  These symptoms usually persist for about 3 to 10 days, but can last up to 2 weeks.  It is important to know that upper respiratory infections do not cause serious illness or complications in most cases.  At this time, I do not recommend antibiotics for your symptoms. If you have severe symptoms (including high fever) or you have symptoms for more than 10 days, antibiotics should be considered.    Upper respiratory infections can be transmitted from person to person, with the most common method of transmission being a person's hands.  The virus is able to live on the skin and can infect other persons for up to 2 hours after direct contact.  Also, these can be transmitted when someone coughs or sneezes; thus, it is important to cover the mouth to reduce this risk.  To keep the spread of the illness at bay, good hand hygiene is very important.  This is an infection that is most likely caused by a virus. There are no specific treatments other than to help you  with the symptoms until the infection runs its course.  We are sorry you are not feeling well.  Here is how we plan to help!   For nasal congestion, you may use an oral decongestants such as Mucinex D or if you have glaucoma or high blood pressure use plain Mucinex.    Saline nasal spray or nasal drops can help and can safely be used as often as needed for congestion.  Try using saline irrigation, such as with a neti pot, several times a day while you are sick. Many neti pots come with salt packets premeasured to use to make saline. If you use your own salt, make sure it is kosher salt or sea salt (don't use table salt as it has iodine in it and you don't need that in your nose). Use distilled water to make saline. If you mix your own saline using your own salt, the recipe is 1/4 teaspoon salt in 1 cup warm water. Using saline irrigation can help prevent and treat sinus infections.   For your congestion, I have prescribed Azelastine nasal spray two sprays in each nostril twice a day  If you do not have a history of heart disease, hypertension, diabetes or thyroid disease, prostate/bladder issues or glaucoma, you may also use Sudafed to treat nasal congestion.  It is highly recommended that you consult with a pharmacist or your primary care physician to ensure this medication is  safe for you to take.     If you have a cough, you may use cough suppressants such as Delsym and Robitussin.  If you have glaucoma or high blood pressure, you can also use Coricidin HBP.   For cough I have prescribed for you A prescription cough medication called Tessalon Perles 100 mg. You may take 1-2 capsules every 8 hours as needed for cough  If you have a sore or scratchy throat, use a saltwater gargle-  to  teaspoon of salt dissolved in a 4-ounce to 8-ounce glass of warm water.  Gargle the solution for approximately 15-30 seconds and then spit.  It is important not to swallow the solution.  You can also use throat  lozenges/cough drops and Chloraseptic spray to help with throat pain or discomfort.  Warm or cold liquids can also be helpful in relieving throat pain.  For headache, pain or general discomfort, you can use Ibuprofen or Tylenol as directed.   Some authorities believe that zinc sprays or the use of Echinacea may shorten the course of your symptoms.   HOME CARE Only take medications as instructed by your medical team. Be sure to drink plenty of fluids. Water is fine as well as fruit juices, sodas and electrolyte beverages. You may want to stay away from caffeine or alcohol. If you are nauseated, try taking small sips of liquids. How do you know if you are getting enough fluid? Your urine should be a pale yellow or almost colorless. Get rest. Taking a steamy shower or using a humidifier may help nasal congestion and ease sore throat pain. You can place a towel over your head and breathe in the steam from hot water coming from a faucet. Using a saline nasal spray works much the same way. Cough drops, hard candies and sore throat lozenges may ease your cough. Avoid close contacts especially the very young and the elderly Cover your mouth if you cough or sneeze Always remember to wash your hands.   GET HELP RIGHT AWAY IF: You develop worsening fever. If your symptoms do not improve within 10 days You develop yellow or green discharge from your nose over 3 days. You have coughing fits You develop a severe head ache or visual changes. You develop shortness of breath, difficulty breathing or start having chest pain Your symptoms persist after you have completed your treatment plan  MAKE SURE YOU  Understand these instructions. Will watch your condition. Will get help right away if you are not doing well or get worse.  Thank you for choosing an e-visit.  Your e-visit answers were reviewed by a board certified advanced clinical practitioner to complete your personal care plan. Depending upon  the condition, your plan could have included both over the counter or prescription medications.  Please review your pharmacy choice. Make sure the pharmacy is open so you can pick up prescription now. If there is a problem, you may contact your provider through [employer] and have the prescription routed to another pharmacy.  Your safety is important to [place]. If you have drug allergies check your prescription carefully.   For the next 24 hours you can use MyChart to ask questions about today's visit, request a non-urgent call back, or ask for a work or school excuse. You will get an email in the next two days asking about your experience. I hope that your e-visit has been valuable and will speed your recovery.  I have spent 5 minutes in review of  e-visit questionnaire, review and updating patient chart, medical decision making and response to patient.   Rica Mast, PhD, FNP-BC

## 2023-06-06 ENCOUNTER — Telehealth: Payer: 59 | Admitting: Nurse Practitioner

## 2023-06-06 DIAGNOSIS — N390 Urinary tract infection, site not specified: Secondary | ICD-10-CM | POA: Diagnosis not present

## 2023-06-06 MED ORDER — CEPHALEXIN 500 MG PO CAPS: 500.0000 mg | ORAL_CAPSULE | Freq: Two times a day (BID) | ORAL | 0 refills | Status: AC

## 2023-06-06 NOTE — Progress Notes (Signed)

## 2023-06-07 ENCOUNTER — Ambulatory Visit: Payer: Self-pay

## 2023-06-11 ENCOUNTER — Encounter: Payer: Self-pay | Admitting: Family Medicine

## 2023-06-11 ENCOUNTER — Ambulatory Visit: Payer: Self-pay | Admitting: Family Medicine

## 2023-06-11 ENCOUNTER — Other Ambulatory Visit (HOSPITAL_COMMUNITY)
Admission: RE | Admit: 2023-06-11 | Discharge: 2023-06-11 | Disposition: A | Discharge status: Home / Self Care | Payer: 59 | Source: Ambulatory Visit | Attending: Family Medicine | Admitting: Family Medicine

## 2023-06-11 VITALS — BP 143/90 | HR 104 | Ht 64.0 in | Wt 184.4 lb

## 2023-06-11 DIAGNOSIS — N946 Dysmenorrhea, unspecified: Secondary | ICD-10-CM | POA: Diagnosis not present

## 2023-06-11 DIAGNOSIS — N921 Excessive and frequent menstruation with irregular cycle: Secondary | ICD-10-CM

## 2023-06-11 DIAGNOSIS — Z124 Encounter for screening for malignant neoplasm of cervix: Secondary | ICD-10-CM | POA: Diagnosis present

## 2023-06-11 NOTE — Progress Notes (Signed)
 Acute visit   Patient: Sydney Banks   DOB: 04-29-1996   27 y.o. Female  MRN: [id number]  Chief Complaint  Patient presents with   Pelvic Pain    Patient reports having vaginal discomfort near her lower left X 3 months on and off. Associated with discharge, slight lower back pain on left side. Pt also reports believeing she had a UTI and took at home test so she was given Keflex  X 7 days from [facility]. Reports she will finish on Thursday but still having discharge. Patient reports dark-light [place] but then this Sunday was red w/clot as well as today. Patient reports never having pa completed. Would like to see about imaging for possible cyst.    Subjective    Discussed the use of AI scribe software for clinical note transcription with the patient, who gave verbal consent to proceed.  History of Present Illness   The patient, with no known gynecological history, presents with left-sided vaginal discomfort that started in early January. Initially, the discomfort was localized to the left side of the pubic area, but it has since moved up towards the hip area and into the lower back. The patient also reports abnormal discharge, which has been ongoing for about a month. The discharge is described as Maxim and occasionally clotted.  The patient's menstrual periods have been notably heavy and painful for the last three cycles. The patient reports having to change a super tampon approximately every two hours during the heaviest days of the period. The patient also reports experiencing Niederer discharge daily for the past month.  The patient has not been sexually active and has no known risk of sexually transmitted diseases. The patient is currently on Keflex  for a suspected urinary tract infection, which was self-diagnosed with an at-home test. The patient reports no significant family history of gynecological conditions.        Review of Systems  Objective    BP (!) 143/90 (BP Location: Left Arm,  Patient Position: Sitting, Cuff Size: Normal)   Pulse (!) 104   Ht 5' 4 (1.626 m)   Wt 184 lb 6.4 oz (83.6 kg)   LMP 05/01/2023 (Approximate)   SpO2 100%   BMI 31.65 kg/m  Physical Exam Vitals reviewed.  Constitutional:      General: She is not in acute distress.    Appearance: She is well-developed.  HENT:     Head: Normocephalic and atraumatic.  Eyes:     General: No scleral icterus.    Conjunctiva/sclera: Conjunctivae normal.  Cardiovascular:     Rate and Rhythm: Normal rate and regular rhythm.  Pulmonary:     Effort: Pulmonary effort is normal. No respiratory distress.  Abdominal:     General: There is no distension.     Palpations: Abdomen is soft. There is no mass.     Tenderness: There is abdominal tenderness (mild, L pelvis). There is no guarding or rebound.  Genitourinary:    Comments: GYN:  External genitalia within normal limits.  Vaginal mucosa pink, moist, normal rugae.  Nonfriable cervix without lesions, + bleeding noted on speculum exam.  Difficulty with speculum exam with patient comfort Skin:    General: Skin is warm and dry.     Findings: No rash.  Neurological:     Mental Status: She is alert and oriented to person, place, and time.  Psychiatric:        Behavior: Behavior normal.       No  results found for any visits on 06/11/23.  Assessment & Plan     Problem List Items Addressed This Visit   None Visit Diagnoses       Dysmenorrhea    -  Primary   Relevant Orders   Von Willebrand Factor  Multimer   CBC w/Diff/Platelet   US  Pelvic Complete With Transvaginal   Iron, TIBC and Ferritin Panel   TSH   Luteinizing hormone   Prolactin   TestT+TestF+SHBG   DHEA-sulfate   Follicle stimulating hormone     Menorrhagia with irregular cycle       Relevant Orders   Von Willebrand Factor  Multimer   CBC w/Diff/Platelet   US  Pelvic Complete With Transvaginal   Iron, TIBC and Ferritin Panel   TSH   Luteinizing hormone   Prolactin   TestT+TestF+SHBG    DHEA-sulfate   Follicle stimulating hormone     Cervical cancer screening       Relevant Orders   Cytology - PAP          Left Pelvic Pain with Abnormal Vaginal Discharge Presents with left-sided pelvic pain radiating to the lower back, associated with abnormal vaginal discharge and heavy, painful periods. Symptoms began in early January, resolved temporarily, and recurred two and a half weeks ago. Differential diagnosis includes ovarian cyst, PCOS, endometriosis, and potential infection. No sexual activity reported, reducing the likelihood of STDs. Currently on Keflex  for a suspected UTI. Discussed the need for a pelvic ultrasound to evaluate structural abnormalities. Birth control pills may be used to regulate bleeding and treat PCOS and endometriosis. Emphasized completing the current course of Keflex . - Order pelvic ultrasound - Order labs: thyroid function tests, CBC, iron levels, von Willebrand's factor, testosterone, estrogen, and prolactin - Perform pelvic exam and Pap smear - Continue Keflex  as prescribed - Schedule follow-up to discuss results  Heavy Menstrual Bleeding Reports heavy menstrual bleeding with significant pain and large clots, requiring frequent tampon changes and setting alarms at night. Differential diagnosis includes hormonal imbalance, PCOS, and von Willebrand's disease. Discussed potential use of birth control pills to regulate the menstrual cycle after obtaining diagnostic results. Explained the risk of anemia due to heavy bleeding and the importance of checking blood counts and iron levels. Informed that von Willebrand's factor will be checked to rule out bleeding disorders. - Order pelvic ultrasound - Order labs: thyroid function tests, CBC, iron levels, von Willebrand's factor, testosterone, estrogen, and prolactin - Perform pelvic exam and Pap smear - Discuss potential use of birth control pills after obtaining diagnostic results  General Health  Maintenance Has not had a Pap smear previously. Discussed the importance of regular cervical cancer screening. Explained that if the Pap smear is normal, the next screening will be in three years. - Perform Pap smear - Schedule next Pap smear in three years if results are normal  Follow-up - Schedule follow-up appointment after ultrasound and lab results are available.       No orders of the defined types were placed in this encounter.    Return if symptoms worsen or fail to improve.      Jon Eva, MD  [facility] [phone] (phone) [phone] (fax)  [facility]

## 2023-06-12 ENCOUNTER — Other Ambulatory Visit: Payer: Self-pay | Admitting: Family Medicine

## 2023-06-12 ENCOUNTER — Ambulatory Visit
Admission: RE | Admit: 2023-06-12 | Discharge: 2023-06-12 | Disposition: A | Discharge status: Home / Self Care | Payer: 59 | Source: Ambulatory Visit | Attending: Family Medicine | Admitting: Family Medicine

## 2023-06-12 DIAGNOSIS — N921 Excessive and frequent menstruation with irregular cycle: Secondary | ICD-10-CM

## 2023-06-12 DIAGNOSIS — Z124 Encounter for screening for malignant neoplasm of cervix: Secondary | ICD-10-CM

## 2023-06-12 DIAGNOSIS — N946 Dysmenorrhea, unspecified: Secondary | ICD-10-CM

## 2023-06-13 ENCOUNTER — Encounter: Payer: Self-pay | Admitting: Family Medicine

## 2023-06-13 ENCOUNTER — Telehealth: Payer: Self-pay

## 2023-06-13 NOTE — Telephone Encounter (Signed)
 Responded via [employer]

## 2023-06-13 NOTE — Telephone Encounter (Signed)
 Copied from CRM [id number]. Topic: General - Other >> Jun 13, 2023  1:58 PM Dominique A wrote: Reason for CRM: Pt is calling back to see when her PCP will be able to go over her lab work from 06/11/2023. Per pt she received her results from her [facility] but see her blood work is abnormal. Please advise.

## 2023-06-17 ENCOUNTER — Other Ambulatory Visit: Payer: Self-pay

## 2023-06-17 ENCOUNTER — Encounter: Payer: Self-pay | Admitting: Family Medicine

## 2023-06-17 DIAGNOSIS — R7989 Other specified abnormal findings of blood chemistry: Secondary | ICD-10-CM

## 2023-06-18 LAB — IRON,TIBC AND FERRITIN PANEL
Ferritin: 49 ng/mL (ref 15–150)
Iron Saturation: 28 % (ref 15–55)
Iron: 96 ug/dL (ref 27–159)
Total Iron Binding Capacity: 346 ug/dL (ref 250–450)
UIBC: 250 ug/dL (ref 131–425)

## 2023-06-18 LAB — CBC WITH DIFFERENTIAL/PLATELET
Basophils Absolute: 0.1 x10E3/uL (ref 0.0–0.2)
Basos: 1 %
EOS (ABSOLUTE): 0.2 x10E3/uL (ref 0.0–0.4)
Eos: 2 %
Hematocrit: 44.5 % (ref 34.0–46.6)
Hemoglobin: 15.4 g/dL (ref 11.1–15.9)
Immature Grans (Abs): 0 x10E3/uL (ref 0.0–0.1)
Immature Granulocytes: 0 %
Lymphocytes Absolute: 1.8 x10E3/uL (ref 0.7–3.1)
Lymphs: 19 %
MCH: 29.7 pg (ref 26.6–33.0)
MCHC: 34.6 g/dL (ref 31.5–35.7)
MCV: 86 fL (ref 79–97)
Monocytes Absolute: 0.6 x10E3/uL (ref 0.1–0.9)
Monocytes: 6 %
Neutrophils Absolute: 6.6 x10E3/uL (ref 1.4–7.0)
Neutrophils: 72 %
Platelets: 369 x10E3/uL (ref 150–450)
RBC: 5.19 x10E6/uL (ref 3.77–5.28)
RDW: 12.3 % (ref 11.7–15.4)
WBC: 9.2 x10E3/uL (ref 3.4–10.8)

## 2023-06-18 LAB — TESTT+TESTF+SHBG
Sex Hormone Binding: 79 nmol/L (ref 24.6–122.0)
Testosterone, Free: 6.5 pg/mL — ABNORMAL HIGH (ref 0.0–4.2)
Testosterone, Total, LC/MS: 81 ng/dL — ABNORMAL HIGH (ref 10.0–55.0)

## 2023-06-18 LAB — LUTEINIZING HORMONE: LH: 33.2 m[IU]/mL

## 2023-06-18 LAB — TSH: TSH: 1.08 u[IU]/mL (ref 0.450–4.500)

## 2023-06-18 LAB — DHEA-SULFATE: DHEA-SO4: 556 ug/dL — ABNORMAL HIGH (ref 84.8–378.0)

## 2023-06-18 LAB — VON WILLEBRAND FACTOR MULTIMER

## 2023-06-18 LAB — PROLACTIN: Prolactin: 28.7 ng/mL (ref 4.8–33.4)

## 2023-06-18 LAB — FOLLICLE STIMULATING HORMONE: FSH: 6.5 m[IU]/mL

## 2023-06-18 LAB — CYTOLOGY - PAP: Diagnosis: NEGATIVE

## 2023-07-19 ENCOUNTER — Encounter: Payer: Self-pay | Admitting: Certified Nurse Midwife

## 2023-07-19 ENCOUNTER — Other Ambulatory Visit: Payer: Self-pay | Admitting: Certified Nurse Midwife

## 2023-07-19 DIAGNOSIS — R102 Pelvic and perineal pain: Secondary | ICD-10-CM

## 2023-07-23 ENCOUNTER — Ambulatory Visit

## 2023-07-23 NOTE — Telephone Encounter (Signed)
 Pt is scheduled to get her ultrasound tomorrow. Please advise

## 2023-07-24 ENCOUNTER — Other Ambulatory Visit: Payer: Self-pay | Admitting: Certified Nurse Midwife

## 2023-07-24 ENCOUNTER — Ambulatory Visit: Payer: Self-pay | Admitting: Family Medicine

## 2023-07-24 ENCOUNTER — Ambulatory Visit
Admission: RE | Admit: 2023-07-24 | Discharge: 2023-07-24 | Disposition: A | Discharge status: Home / Self Care | Source: Ambulatory Visit | Attending: Certified Nurse Midwife | Admitting: Certified Nurse Midwife

## 2023-07-24 ENCOUNTER — Telehealth: Payer: Self-pay

## 2023-07-24 ENCOUNTER — Telehealth: Payer: Self-pay | Admitting: Family Medicine

## 2023-07-24 ENCOUNTER — Ambulatory Visit

## 2023-07-24 DIAGNOSIS — R1902 Left upper quadrant abdominal swelling, mass and lump: Secondary | ICD-10-CM | POA: Diagnosis present

## 2023-07-24 DIAGNOSIS — R102 Pelvic and perineal pain: Secondary | ICD-10-CM | POA: Diagnosis present

## 2023-07-24 DIAGNOSIS — R1010 Upper abdominal pain, unspecified: Secondary | ICD-10-CM

## 2023-07-24 NOTE — Telephone Encounter (Signed)
 Copied from CRM [id number]. Topic: Clinical - Request for Lab/Test Order >> Jul 24, 2023  9:25 AM Elle L wrote: Reason for CRM: The patient has an ultrasound scheduled today at 3:15 at an outpatient imaging center and her OB/GYN is only able to order a pelvic ultrasound but she is requesting an order for an abdomen and back. The patient's call back number is [phone].

## 2023-07-24 NOTE — Telephone Encounter (Signed)
 Chief Complaint: Abdominal pain  Symptoms: Abdominal pain that radiates to her back and down her hip Frequency: started in January Pertinent Negatives: Patient denies fever, CP, SOB Disposition: [] ED /[] Urgent Care (no appt availability in office) / [] Appointment(In office/virtual)/ []  [facility]/ [] Home Care/ [] Refused Recommended Disposition /[] [place] [facility]/ [x]  Follow-up with PCP Additional Notes: patient calling with complaints of continued abdominal pain. Abdominal pain started back in January along with Dysmenorrhea. Patient has an appointment for pelvic ultrasound today at 3:15 pm at outpatient imaging center. Patient is wanting to have ultrasounds of abdomen and back ordered to be done today with her pelvic ultrasound. Patient has already asked OB if they would order these two ultrasounds which they stated that PCP needed to order them. Patient states abdominal pain hasn't changed. Patient was instructed that if orders were placed by PCP, there is a possibility that the additional ultrasounds were not going to be able to be completed today due to time constraints of appointment windows. Patient verbalized understanding. Patient is requesting a phone call from PCP office in regards to possible ultrasounds. No further questions   Copied from CRM [id number]. Topic: Clinical - Request for Lab/Test Order >> Jul 24, 2023  9:25 AM Elle L wrote: Reason for CRM: The patient has an ultrasound scheduled today at 3:15 at an outpatient imaging center and her OB/GYN is only able to order a pelvic ultrasound but she is requesting an order for an abdomen and back. The patient's call back number is [phone]. >> Jul 24, 2023 11:11 AM Nyra Capes wrote: Patient called back in asking if the order for the [place] ABD and back.  Patient is having pain in left front and back ribs moving down, patient noticed a bulge on left side of stomach  on Saturday night that is still there, bloated stomach. She  has [place] today for Pelvic and asking for other US done at the sametime Reason for Disposition  [1] MILD-MODERATE pain AND [2] constant AND [3] present > 2 hours  Answer Assessment - Initial Assessment Questions 1. LOCATION: "Where does it hurt?"      Left abdomen 2. RADIATION: "Does the pain shoot anywhere else?" (e.g., chest, back)     Radiates to back down to hip 3. ONSET: "When did the pain begin?" (e.g., minutes, hours or days ago)      Started initial onset in [place] but last weeks have been consistent 4. SUDDEN: "Gradual or sudden onset?"     gradual 5. PATTERN "Does the pain come and go, or is it constant?"    - If it comes and goes: "How long does it last?" "Do you have pain now?"     (Note: Comes and goes means the pain is intermittent. It goes away completely between bouts.)    - If constant: "Is it getting better, staying the same, or getting worse?"      (Note: Constant means the pain never goes away completely; most serious pain is constant and gets worse.)      Constant last two weeks 6. SEVERITY: "How bad is the pain?"  (e.g., Scale 1-10; mild, moderate, or severe)    - MILD (1-3): Doesn't interfere with normal activities, abdomen soft and not tender to touch.     - MODERATE (4-7): Interferes with normal activities or awakens from sleep, abdomen tender to touch.     - SEVERE (8-10): Excruciating pain, doubled over, unable to do any normal activities.  Pain between 5 and 6 7. RECURRENT SYMPTOM: "Have you ever had this type of stomach pain before?" If Yes, ask: "When was the last time?" and "What happened that time?"      Pain has occurred every day 8. CAUSE: "What do you think is causing the stomach pain?"     unsure 9. RELIEVING/AGGRAVATING FACTORS: "What makes it better or worse?" (e.g., antacids, bending or twisting motion, bowel movement)     Better at work 10. OTHER SYMPTOMS: "Do you have any other symptoms?" (e.g., back pain, diarrhea, fever, urination pain,  vomiting)       Back pain 11. PREGNANCY: "Is there any chance you are pregnant?" "When was your last menstrual period?"       No  Protocols used: Abdominal Pain - [facility]

## 2023-07-24 NOTE — Telephone Encounter (Signed)
 Copied from CRM [id number]. Topic: Clinical - Request for Lab/Test Order >> Jul 24, 2023  9:25 AM Elle L wrote: Reason for CRM: The patient has an ultrasound scheduled today at 3:15 at an outpatient imaging center and her OB/GYN is only able to order a pelvic ultrasound but she is requesting an order for an abdomen and back. The patient's call back number is [phone]. >> Jul 24, 2023 11:11 AM Nyra Capes wrote: Patient called back in asking if the order for the [place] ABD and back.  Patient is having pain in left front and back ribs moving down, patient noticed a bulge on left side of stomach  on Saturday night that is still there, bloated stomach. She has [place] today for Pelvic and asking for other US done at the sametime

## 2023-07-25 ENCOUNTER — Encounter: Payer: Self-pay | Admitting: Certified Nurse Midwife

## 2023-07-25 NOTE — Telephone Encounter (Signed)
 US Abdomen Limited added yesterday

## 2023-07-25 NOTE — Telephone Encounter (Signed)
 Order changed by patient's OBGYN.

## 2023-07-26 ENCOUNTER — Encounter: Payer: 59 | Admitting: Certified Nurse Midwife

## 2023-07-29 ENCOUNTER — Ambulatory Visit: Admitting: Obstetrics

## 2023-07-29 ENCOUNTER — Other Ambulatory Visit (HOSPITAL_COMMUNITY)
Admission: RE | Admit: 2023-07-29 | Discharge: 2023-07-29 | Disposition: A | Discharge status: Home / Self Care | Source: Ambulatory Visit | Attending: Obstetrics | Admitting: Obstetrics

## 2023-07-29 ENCOUNTER — Encounter: Payer: Self-pay | Admitting: Obstetrics

## 2023-07-29 VITALS — BP 125/77 | HR 74 | Ht 64.0 in | Wt 176.0 lb

## 2023-07-29 DIAGNOSIS — N898 Other specified noninflammatory disorders of vagina: Secondary | ICD-10-CM

## 2023-07-29 DIAGNOSIS — R7989 Other specified abnormal findings of blood chemistry: Secondary | ICD-10-CM | POA: Diagnosis not present

## 2023-07-29 DIAGNOSIS — N926 Irregular menstruation, unspecified: Secondary | ICD-10-CM | POA: Diagnosis not present

## 2023-07-29 DIAGNOSIS — E282 Polycystic ovarian syndrome: Secondary | ICD-10-CM | POA: Diagnosis not present

## 2023-07-29 MED ORDER — JUNEL FE 1/20 1-20 MG-MCG PO TABS: 1.0000 | ORAL_TABLET | Freq: Every day | ORAL | 11 refills | Status: DC

## 2023-07-29 NOTE — Progress Notes (Signed)
 GYNECOLOGY PROGRESS NOTE: NEW PATIENT  Subjective:  PCP: Erasmo Downer, MD  Patient ID: Clinton Gallant, female    DOB: 1995/11/12, 28 y.o.   MRN: [id number]  HPI  Patient is a 28 y.o. G0P0000 female who presents for referral from Shirlee Latch MD for elevated DHEA, testosterone, and irregular periods, suspected PCOS. She had an ultrasound on 07/24/23 for pelvic pain that showed a normal uterus, both ovaries > 10cc. Her testosterone = 71, DHEA = 556. Her periods started around 7th grade, (12-13yo) and have always been irregular, coming about every 30-60 days, lasting 5-10 days, and heavy, sometimes passing tissue/clots. She once skipped a period for almost 4 mos, but this was a one-time occurrence. Also has severe cramping and needs to change her pad Q2hrs. C/o dark [place] vaginal discharge since Jan '25, shows pictures on her phone today of the discharge and the heavy bleeding/clots. Denies vaginal itching, odor, pain. She does not smoke, no hx of migraine with aura, and no hx of VTE.   GYN Hx Last pap: 06/11/23, NILM, denies hx of abnormal STI Hx: denies Sexually active: not at this time Contraception: abstinence  OB History     Gravida  0   Para  0   Term  0   Preterm  0   AB  0   Living  0      SAB  0   IAB  0   Ectopic  0   Multiple  0   Live Births  0          Past Medical History:  Diagnosis Date   Anxiety    Panic attacks    Past Surgical History:  Procedure Laterality Date   NO PAST SURGERIES     Family History  Problem Relation Age of Onset   Gallbladder disease Father    Hypertension Father    Down syndrome Sibling    Lung cancer Paternal Grandfather    Thyroid cancer Maternal Uncle    Breast cancer Neg Hx    Colon cancer Neg Hx    Social History   Socioeconomic History   Marital status: Single    Spouse name: Not on file   Number of children: 0   Years of education: Not on file   Highest education level: Bachelor's degree  (e.g., BA, AB, BS)  Occupational History   Occupation: Midwife  Tobacco Use   Smoking status: Never   Smokeless tobacco: Never  Vaping Use   Vaping status: Never Used  Substance and Sexual Activity   Alcohol use: No   Drug use: No   Sexual activity: Not Currently  Other Topics Concern   Not on file  Social History Narrative   Not on file   Social Drivers of Health   Financial Resource Strain: Low Risk  (06/10/2023)   Overall Financial Resource Strain (CARDIA)    Difficulty of Paying Living Expenses: Not very hard  Food Insecurity: No Food Insecurity (06/10/2023)   Hunger Vital Sign    Worried About Running Out of Food in the Last Year: Never true    Ran Out of Food in the Last Year: Never true  Transportation Needs: No Transportation Needs (06/10/2023)   PRAPARE - Administrator, Civil Service (Medical): No    Lack of Transportation (Non-Medical): No  Physical Activity: Unknown (06/10/2023)   Exercise Vital Sign    Days of Exercise per Week: 0 days    Minutes  of Exercise per Session: Not on file  Stress: Stress Concern Present (06/10/2023)   [employer] of Occupational Health - Occupational Stress Questionnaire    Feeling of Stress : To some extent  Social Connections: Moderately Integrated (06/10/2023)   Social Connection and Isolation Panel [NHANES]    Frequency of Communication with Friends and Family: More than three times a week    Frequency of Social Gatherings with Friends and Family: More than three times a week    Attends Religious Services: More than 4 times per year    Active Member of [employer] or Organizations: Yes    Attends Engineer, structural: More than 4 times per year    Marital Status: Never married  Intimate Partner Violence: Not on file   Current Outpatient Medications on File Prior to Visit  Medication Sig Dispense Refill   hydrOXYzine (VISTARIL) 25 MG capsule Take 1 capsule (25 mg total) by mouth every 8 (eight) hours as  needed for anxiety. 30 capsule 2   No current facility-administered medications on file prior to visit.   No Known Allergies  Review of Systems Pertinent items are noted in HPI.   Objective:   Blood pressure 125/77, pulse 74, height 5\' 4"  (1.626 m), weight 176 lb (79.8 kg), last menstrual period 07/24/2023. Body mass index is 30.21 kg/m.  General appearance: alert, cooperative, no distress, and anxious Abdomen: soft, non-tender; bowel sounds normal; no masses,  no organomegaly; some tenderness on L abd and pelvis Pelvic: cervix normal in appearance, external genitalia normal, no adnexal masses or tenderness, no cervical motion tenderness, rectovaginal septum normal, uterus normal size, shape, and consistency, and vagina normal without discharge Extremities: extremities normal, atraumatic, no cyanosis or edema Neurologic: Grossly normal  EXAM: TRANSABDOMINAL AND TRANSVAGINAL ULTRASOUND OF PELVIS   DOPPLER ULTRASOUND OF OVARIES   TECHNIQUE: Both transabdominal and transvaginal ultrasound examinations of the pelvis were performed. Transabdominal technique was performed for global imaging of the pelvis including uterus, ovaries, adnexal regions, and pelvic cul-de-sac.   It was necessary to proceed with endovaginal exam following the transabdominal exam to visualize the uterus endometrium and ovaries. Color and duplex Doppler ultrasound was utilized to evaluate blood flow to the ovaries.   COMPARISON:  Pelvic ultrasound June 12, 2023   FINDINGS: Uterus   Measurements: 6.9 x 3.4 x 3.8 cm = volume: 46.6 mL. No fibroids or other mass visualized.   Endometrium   Thickness: 7.3 mm.  No focal abnormality visualized.   Right ovary   Measurements: 4.4 x 1.7 by 3.0 cm = volume: 11.7 mL. Normal appearance/no adnexal mass.   Left ovary   Measurements: 3.1 x 1.7 x 3.6 cm = volume: 10 0 mL. Normal appearance/no adnexal mass.   Pulsed Doppler evaluation of both ovaries  demonstrates normal low-resistance arterial and venous waveforms.   Other findings   No abnormal free fluid.   IMPRESSION: Normal pelvic ultrasound.  DHEA-SO4 556.0 High    Testosterone, Total, LC/MS 81.0 High   Testosterone, Free 6.5 High   Sex Hormone Binding 79.0   Prolactin 28.7   FSH 6.5   LH 33.2   TSH 1.080    Assessment/Plan:   1. PCOS (polycystic ovarian syndrome)   2. Vaginal discharge   3. Irregular periods   4. Elevated DHEA     JASIYA MARKIE is a 28 y.o. G0P0000 today at the request of her PCP. Her labs and [place] meet diagnostic criteria for PCOS.   -Discussed pathophysiology, symptoms and concerns  of PCOS in detail, Patient Handout in AVS. -Discussed treatment options and pt desires OCPs at this time.  -Rx for Junel, prefers monthly cycle -- SEs discussed, including BTB, N&V, weight changes, increased moodiness. If symptoms are severe, or mild SEs do not improve after 3 mos, call or RTC to discuss options. -RTC 3 months, sooner prn  Elevated DHEA:  -Out of abundance of caution, will order CT abd to rule out adrenal mass. Will call with results.   Julieanne Manson, DO [facility]

## 2023-07-30 LAB — CERVICOVAGINAL ANCILLARY ONLY
Bacterial Vaginitis (gardnerella): NEGATIVE
Candida Glabrata: NEGATIVE
Candida Vaginitis: NEGATIVE
Chlamydia: NEGATIVE
Comment: NEGATIVE
Comment: NEGATIVE
Comment: NEGATIVE
Comment: NEGATIVE
Comment: NEGATIVE
Comment: NORMAL
Neisseria Gonorrhea: NEGATIVE
Trichomonas: NEGATIVE

## 2023-08-05 ENCOUNTER — Encounter: Payer: Self-pay | Admitting: Obstetrics

## 2023-08-06 ENCOUNTER — Other Ambulatory Visit
Admission: RE | Admit: 2023-08-06 | Discharge: 2023-08-06 | Disposition: A | Discharge status: Home / Self Care | Source: Ambulatory Visit | Attending: Obstetrics | Admitting: Obstetrics

## 2023-08-06 ENCOUNTER — Ambulatory Visit
Admission: RE | Admit: 2023-08-06 | Discharge: 2023-08-06 | Disposition: A | Discharge status: Home / Self Care | Source: Ambulatory Visit | Attending: Obstetrics | Admitting: Obstetrics

## 2023-08-06 DIAGNOSIS — R7989 Other specified abnormal findings of blood chemistry: Secondary | ICD-10-CM | POA: Insufficient documentation

## 2023-08-06 LAB — BASIC METABOLIC PANEL WITH GFR
Anion gap: 11 (ref 5–15)
BUN: 18 mg/dL (ref 6–20)
CO2: 25 mmol/L (ref 22–32)
Calcium: 9.6 mg/dL (ref 8.9–10.3)
Chloride: 101 mmol/L (ref 98–111)
Creatinine, Ser: 0.77 mg/dL (ref 0.44–1.00)
GFR, Estimated: >60 mL/min (ref >60)
Glucose, Bld: 78 mg/dL (ref 70–99)
Potassium: 4.1 mmol/L (ref 3.5–5.1)
Sodium: 137 mmol/L (ref 135–145)

## 2023-08-06 MED ORDER — IOHEXOL 300 MG/ML  SOLN
100.0000 mL | Freq: Once | INTRAMUSCULAR | Status: AC | PRN
  Administered 2023-08-06: 100 mL via INTRAVENOUS

## 2023-08-12 ENCOUNTER — Encounter: Payer: Self-pay | Admitting: Obstetrics

## 2023-08-12 NOTE — Telephone Encounter (Signed)
**Note De-identified  [place] Obfuscation** Please advise 

## 2023-08-13 ENCOUNTER — Encounter: Payer: Self-pay | Admitting: Obstetrics

## 2023-08-22 ENCOUNTER — Encounter: Payer: Self-pay | Admitting: Obstetrics

## 2023-09-19 ENCOUNTER — Encounter: Payer: Self-pay | Admitting: Family Medicine

## 2023-09-20 ENCOUNTER — Other Ambulatory Visit: Payer: Self-pay

## 2023-09-20 MED ORDER — HYDROXYZINE PAMOATE 25 MG PO CAPS: ORAL_CAPSULE | ORAL | 2 refills | Status: AC

## 2023-09-20 NOTE — Telephone Encounter (Signed)
 Ok to refill hydroxyzine  25mg  pills. Change instructions to take 12.5-25mg  q8h prn #30 r2. Then she can take half a pill at bedtime or when she needs a lower dose.

## 2023-10-28 NOTE — Progress Notes (Unsigned)
    GYNECOLOGY PROGRESS NOTE  Subjective:  PCP: Myrla Jon HERO, MD  Patient ID: Sydney Banks, female    DOB: [birth year], 28 y.o.   MRN: [id number]  HPI  Patient is a 28 y.o. G0P0000 female who presents for follow up visit from 3/24, pt was referred to Dr Leigh for elevated DHEA and PCOS. Dr. Leigh put pt on Junel , pt prefers a monthly cycles. Also ordered a CT to elevate for adrenal adenoma. Pt had the ct on 4/1.  {Common ambulatory SmartLinks:19316}  Review of Systems {ros; complete:30496}   Objective:   There were no vitals taken for this visit. There is no height or weight on file to calculate BMI.  General appearance: {general exam:16600} Abdomen: {abdominal exam:16834} Pelvic: {pelvic exam:16852::cervix normal in appearance,external genitalia normal,no adnexal masses or tenderness,no cervical motion tenderness,rectovaginal septum normal,uterus normal size, shape, and consistency,vagina normal without discharge} Extremities: {extremity exam:5109} Neurologic: {neuro exam:17854} CLINICAL DATA:  Elevated DHEA. Polycystic ovary syndrome. Evaluate for adrenal adenoma.   EXAM: CT ABDOMEN WITHOUT AND WITH CONTRAST   TECHNIQUE: Multidetector CT imaging of the abdomen was performed following the standard protocol before and following the bolus administration of intravenous contrast.   RADIATION DOSE REDUCTION: This exam was performed according to the departmental dose-optimization program which includes automated exposure control, adjustment of the mA and/or kV according to patient size and/or use of iterative reconstruction technique.   CONTRAST:  OMNIPAQUE  IOHEXOL  300 MG/ML  SOLN   COMPARISON:  None Available.   FINDINGS: Lower chest: No acute findings.   Hepatobiliary: No hepatic masses identified. Gallbladder is unremarkable. No evidence of biliary ductal dilatation.   Pancreas:  No mass or inflammatory changes.   Spleen:  Within normal limits  in size and appearance.   Adrenals/Urinary Tract: Both adrenal glands are normal appearance. No evidence of adrenal masses or hyperplasia.   Tiny benign cyst seen in lower pole of left kidney (No followup imaging is recommended). No renal masses identified. No evidence of hydronephrosis.   Stomach/Bowel: Unremarkable.   Vascular/Lymphatic: No pathologically enlarged lymph nodes identified. No acute vascular findings.   Other:  None.  No other retroperitoneal mass identified.   Musculoskeletal:  No suspicious bone lesions identified.   IMPRESSION: Negative.  No evidence of adrenal or other retroperitoneal mass.    Assessment/Plan:   No diagnosis found.   There are no diagnoses linked to this encounter.     Estil Leigh, DO [facility]

## 2023-10-30 ENCOUNTER — Encounter: Payer: Self-pay | Admitting: Obstetrics

## 2023-10-30 ENCOUNTER — Ambulatory Visit: Admitting: Obstetrics

## 2023-10-30 VITALS — BP 125/92 | HR 82 | Ht 64.0 in | Wt 175.0 lb

## 2023-10-30 DIAGNOSIS — R7989 Other specified abnormal findings of blood chemistry: Secondary | ICD-10-CM | POA: Diagnosis not present

## 2023-10-30 DIAGNOSIS — N946 Dysmenorrhea, unspecified: Secondary | ICD-10-CM

## 2023-10-30 DIAGNOSIS — E282 Polycystic ovarian syndrome: Secondary | ICD-10-CM

## 2023-10-30 MED ORDER — NORETHINDRONE ACET-ETHINYL EST 1.5-30 MG-MCG PO TABS: 1.0000 | ORAL_TABLET | Freq: Every day | ORAL | 3 refills | Status: DC

## 2023-11-12 ENCOUNTER — Telehealth: Admitting: Physician Assistant

## 2023-11-12 ENCOUNTER — Encounter: Payer: Self-pay | Admitting: Family Medicine

## 2023-11-12 DIAGNOSIS — H60331 Swimmer's ear, right ear: Secondary | ICD-10-CM | POA: Diagnosis not present

## 2023-11-12 MED ORDER — CIPROFLOXACIN-DEXAMETHASONE 0.3-0.1 % OT SUSP: OTIC | 0 refills | Status: DC

## 2023-11-12 NOTE — Progress Notes (Signed)
 E Visit for Ear Pain - Swimmer's Ear  We are sorry that you are not feeling well. Here is how we plan to help!  Based on what you have shared with me it looks like you have Swimmer's Ear.  Swimmer's ear is a redness or swelling, irritation, or infection of your outer ear canal. These symptoms usually occur within a few days of swimming. Your ear canal is a tube that goes from the opening of the ear to the eardrum.  When water stays in your ear canal, germs can grow.  This is a painful condition that often happens to children and swimmers of all ages.  It is not contagious and oral antibiotics are not required to treat uncomplicated swimmer's ear.  The usual symptoms include:    Itchiness inside the ear  Redness or a sense of swelling in the ear  Pain when the ear is tugged on when pressure is placed on the ear  Pus draining from the infected ear   I have prescribed: Ciprofloxin 0.2% and dexamethazone otic suspension 4 drops in affected ears twice daily for 7 days  In certain cases, swimmer's ear may progress to a more serious bacterial infection of the middle or inner ear.  If you have a fever 102 and up and significantly worsening symptoms, this could indicate a more serious infection moving to the middle/inner and needs face to face evaluation in an office by a provider.  Your symptoms should improve over the next 3 days and should resolve in about 7 days.  Be sure to complete ALL of your prescription.  HOME CARE: Wash your hands frequently. If you are prescribed an ear drop, do not place the tip of the bottle on your ear or touch it with your fingers. You can take Acetaminophen 650 mg every 4-6 hours as needed for pain.  If pain is severe or moderate, you can apply a heating pad (set on low) or hot water bottle (wrapped in a towel) to outer ear for 20 minutes.  This will also increase drainage. Avoid ear plugs Do not go swimming until the symptoms are gone Do not use Q-tips After showers,  help the water run out by tilting your head to one side.   GET HELP RIGHT AWAY IF: Fever is over 102.2 degrees. You develop progressive ear pain or hearing loss. Ear symptoms persist longer than 3 days after treatment.  MAKE SURE YOU: Understand these instructions. Will watch your condition. Will get help right away if you are not doing well or get worse.  TO PREVENT SWIMMER'S EAR: Use a bathing cap or custom fitted swim molds to keep your ears dry. Towel off after swimming to dry your ears. Tilt your head or pull your earlobes to allow the water to escape your ear canal. If there is still water in your ears, consider using a hairdryer on the lowest setting.  Thank you for choosing an e-visit.  Your e-visit answers were reviewed by a board certified advanced clinical practitioner to complete your personal care plan. Depending upon the condition, your plan could have included both over the counter or prescription medications.  Please review your pharmacy choice. Make sure the pharmacy is open so you can pick up the prescription now. If there is a problem, you may contact your provider through [employer] and have the prescription routed to another pharmacy.  Your safety is important to us . If you have drug allergies check your prescription carefully.   For  the next 24 hours you can use MyChart to ask questions about today's visit, request a non-urgent call back, or ask for a work or school excuse. You will get an email with a survey after your eVisit asking about your experience. We would appreciate your feedback. I hope that your e-visit has been valuable and will aid in your recovery.

## 2023-11-12 NOTE — Progress Notes (Signed)
 I have spent 5 minutes in review of e-visit questionnaire, review and updating patient chart, medical decision making and response to patient.   Piedad Climes, PA-C

## 2023-11-22 ENCOUNTER — Encounter: Payer: Self-pay | Admitting: Obstetrics

## 2024-01-27 NOTE — Progress Notes (Unsigned)
    GYNECOLOGY PROGRESS NOTE  Subjective:  PCP: Myrla Jon HERO, MD  Patient ID: Sydney Banks, female    DOB: March 28, 1996, 28 y.o.   MRN: [id number]  HPI  Patient is a 28 y.o. G0P0000 female who presents for follow-up on OCPs rx'd for PCOS.  She was started on Junel  in March after being diagnosed with PCOS.  She returned in June for her three month follow up and reported she continues to have bleeding and cramping when her placebo pills are not due.  She was given an increased dose of Loestrin to try for the next three months in hopes to regulate bleeding.  {Common ambulatory SmartLinks:19316}  Review of Systems {ros; complete:30496}   Objective:   There were no vitals taken for this visit. There is no height or weight on file to calculate BMI.  General appearance: {general exam:16600} Abdomen: {abdominal exam:16834} Pelvic: {pelvic exam:16852::cervix normal in appearance,external genitalia normal,no adnexal masses or tenderness,no cervical motion tenderness,rectovaginal septum normal,uterus normal size, shape, and consistency,vagina normal without discharge} Extremities: {extremity exam:5109} Neurologic: {neuro exam:17854}   Assessment/Plan:   No diagnosis found.   There are no diagnoses linked to this encounter.     Estil Mangle, DO [facility]

## 2024-01-29 ENCOUNTER — Encounter: Payer: Self-pay | Admitting: Obstetrics

## 2024-01-29 ENCOUNTER — Ambulatory Visit (INDEPENDENT_AMBULATORY_CARE_PROVIDER_SITE_OTHER): Admitting: Obstetrics

## 2024-01-29 VITALS — BP 126/79 | HR 73 | Ht 64.0 in | Wt 169.8 lb

## 2024-01-29 DIAGNOSIS — N946 Dysmenorrhea, unspecified: Secondary | ICD-10-CM

## 2024-01-29 DIAGNOSIS — E282 Polycystic ovarian syndrome: Secondary | ICD-10-CM

## 2024-01-29 MED ORDER — NORETHINDRONE ACET-ETHINYL EST 1.5-30 MG-MCG PO TABS: 1.0000 | ORAL_TABLET | Freq: Every day | ORAL | 3 refills | Status: AC

## 2024-02-18 NOTE — Progress Notes (Unsigned)
  Electrophysiology Office Follow up Visit Note:    Date:  02/19/2024   ID:  Sydney Banks, DOB Jun 16, 1995, MRN [id number]  PCP:  Myrla Jon HERO, MD  [facility] Cardiologist:  Redell Cave, MD  [facility] Electrophysiologist:  OLE ONEIDA HOLTS, MD    Interval History:     Sydney Banks is a 28 y.o. female who presents for a follow up visit.   I last saw the patient in April 2023 for palpitations.  A 2022 heart monitor showed rare ectopy.  We reviewed her medical history today.  She has been feeling well.  She was recently diagnosed with PCOS.  She has an upcoming trip to [place] in January.  She just wants some reassurance that she is safe to go on that trip.  No recent syncope or presyncope.  Her palpitations feel better than they were in the past.      Past medical, surgical, social and family history were reviewed.  ROS:   Please see the history of present illness.    All other systems reviewed and are negative.  EKGs/Labs/Other Studies Reviewed:    The following studies were reviewed today:     EKG Interpretation Date/Time:  Wednesday February 19 2024 15:32:49 EDT Ventricular Rate:  75 PR Interval:  124 QRS Duration:  86 QT Interval:  374 QTC Calculation: 417 R Axis:   50  Text Interpretation: Normal sinus rhythm Normal ECG No previous ECGs available Confirmed by HOLTS OLE [phone]) on 02/19/2024 3:52:54 PM    Physical Exam:    VS:  BP 90/60 (BP Location: Left Arm, Patient Position: Sitting, Cuff Size: Normal)   Pulse 75   Ht 5' 4 (1.626 m)   Wt 169 lb 6.4 oz (76.8 kg)   LMP 01/28/2024 (Exact Date)   SpO2 96%   BMI 29.08 kg/m     Wt Readings from Last 3 Encounters:  02/19/24 169 lb 6.4 oz (76.8 kg)  01/29/24 169 lb 12.8 oz (77 kg)  10/30/23 175 lb (79.4 kg)     GEN: no distress CARD: RRR, No MRG RESP: No IWOB. CTAB.      ASSESSMENT:    1. Palpitations    PLAN:    In order of problems listed  above:  #Palpitations I suspect her palpitations are secondary to rare PACs and PVCs.   I have recommended a 2-week [employer] monitor to confirm.  She should maintain adequate hydration.  I do not think she needs to limit her activities.  She can follow-up as needed.  I did discuss my upcoming departure from [facility].  She can follow-up with Dr. Kennyth in the future if necessary.  Signed, OLE HOLTS, MD, [facility], [facility] 02/19/2024 3:53 PM    Electrophysiology [facility]

## 2024-02-19 ENCOUNTER — Encounter: Payer: Self-pay | Admitting: Cardiology

## 2024-02-19 ENCOUNTER — Ambulatory Visit: Attending: Cardiology | Admitting: Cardiology

## 2024-02-19 ENCOUNTER — Ambulatory Visit (INDEPENDENT_AMBULATORY_CARE_PROVIDER_SITE_OTHER)

## 2024-02-19 VITALS — BP 90/60 | HR 75 | Ht 64.0 in | Wt 169.4 lb

## 2024-02-19 DIAGNOSIS — R002 Palpitations: Secondary | ICD-10-CM

## 2024-02-19 NOTE — Patient Instructions (Addendum)
 Medication Instructions:  Your physician recommends that you continue on your current medications as directed. Please refer to the Current Medication list given to you today.  *If you need a refill on your cardiac medications before your next appointment, please call your pharmacy*  Testing/Procedures: Event monitor -  Your physician has recommended that you wear an event monitor. Event monitors are medical devices that record the heart's electrical activity. Doctors most often us  these monitors to diagnose arrhythmias. Arrhythmias are problems with the speed or rhythm of the heartbeat. The monitor is a small, portable device. You can wear one while you do your normal daily activities. This is usually used to diagnose what is causing palpitations/syncope (passing out).  Follow-Up: At [facility], you and your health needs are our priority.  As part of our continuing mission to provide you with exceptional heart care, our providers are all part of one team.  This team includes your primary Cardiologist (physician) and Advanced Practice Providers or APPs (Physician Assistants and Nurse Practitioners) who all work together to provide you with the care you need, when you need it.  Your next appointment:   As needed follow up with EP

## 2024-02-21 NOTE — Progress Notes (Unsigned)
 Enrolled patient for a 14 day [employer] monitor to be mailed to patients home

## 2024-04-10 ENCOUNTER — Ambulatory Visit: Payer: Self-pay | Admitting: Cardiology

## 2024-04-10 DIAGNOSIS — R002 Palpitations: Secondary | ICD-10-CM | POA: Diagnosis not present

## 2024-04-28 ENCOUNTER — Telehealth: Admitting: Nurse Practitioner

## 2024-04-28 DIAGNOSIS — J208 Acute bronchitis due to other specified organisms: Secondary | ICD-10-CM | POA: Diagnosis not present

## 2024-04-28 MED ORDER — ALBUTEROL SULFATE HFA 108 (90 BASE) MCG/ACT IN AERS: 1.0000 | INHALATION_SPRAY | Freq: Four times a day (QID) | RESPIRATORY_TRACT | 0 refills | Status: DC | PRN

## 2024-04-28 MED ORDER — ALBUTEROL SULFATE HFA 108 (90 BASE) MCG/ACT IN AERS: 1.0000 | INHALATION_SPRAY | Freq: Four times a day (QID) | RESPIRATORY_TRACT | 0 refills | Status: AC | PRN

## 2024-04-28 MED ORDER — PREDNISONE 20 MG PO TABS: 40.0000 mg | ORAL_TABLET | Freq: Every day | ORAL | 0 refills | Status: AC

## 2024-04-28 MED ORDER — BENZONATATE 100 MG PO CAPS: 100.0000 mg | ORAL_CAPSULE | Freq: Three times a day (TID) | ORAL | 0 refills | Status: DC | PRN

## 2024-04-28 NOTE — Progress Notes (Signed)
 We are sorry that you are not feeling well.  Here is how we plan to help!  Based on your presentation I believe you most likely have A cough due to a virus.  This is called viral bronchitis and is best treated by rest, plenty of fluids and control of the cough.  You may use Ibuprofen  or Tylenol as directed to help your symptoms.     In addition you may use A non-prescription cough medication called Mucinex DM: take 2 tablets every 12 hours. and A prescription cough medication called Tessalon  Perles 100mg . You may take 1-2 capsules every 8 hours as needed for your cough.  I have prescribed Prednisone  20mg  Take 2 tablets (40mg ) daily for 5 days.   I have prescribed Albuterol  inhaler Use 1-2 puffs every 6 hours as needed for shortness of breath, chest tightness, and/or wheezing.  From your responses in the eVisit questionnaire you describe inflammation in the upper respiratory tract which is causing a significant cough.  This is commonly called Bronchitis and has four common causes:   Allergies Viral Infections Acid Reflux Bacterial Infection Allergies, viruses and acid reflux are treated by controlling symptoms or eliminating the cause. An example might be a cough caused by taking certain blood pressure medications. You stop the cough by changing the medication. Another example might be a cough caused by acid reflux. Controlling the reflux helps control the cough.  USE OF BRONCHODILATOR (RESCUE) INHALERS: There is a risk from using your bronchodilator too frequently.  The risk is that over-reliance on a medication which only relaxes the muscles surrounding the breathing tubes can reduce the effectiveness of medications prescribed to reduce swelling and congestion of the tubes themselves.  Although you feel brief relief from the bronchodilator inhaler, your asthma may actually be worsening with the tubes becoming more swollen and filled with mucus.  This can delay other crucial treatments, such as  oral steroid medications. If you need to use a bronchodilator inhaler daily, several times per day, you should discuss this with your provider.  There are probably better treatments that could be used to keep your asthma under control.     HOME CARE Only take medications as instructed by your medical team. Complete the entire course of an antibiotic. Drink plenty of fluids and get plenty of rest. Avoid close contacts especially the very young and the elderly Cover your mouth if you cough or cough into your sleeve. Always remember to wash your hands A steam or ultrasonic humidifier can help congestion.   GET HELP RIGHT AWAY IF: You develop worsening fever. You become short of breath You cough up blood. Your symptoms persist after you have completed your treatment plan MAKE SURE YOU  Understand these instructions. Will watch your condition. Will get help right away if you are not doing well or get worse.  Your e-visit answers were reviewed by a board certified advanced clinical practitioner to complete your personal care plan.  Depending on the condition, your plan could have included both over the counter or prescription medications. If there is a problem please reply  once you have received a response from your provider. Your safety is important to us .  If you have drug allergies check your prescription carefully.    You can use MyChart to ask questions about todays visit, request a non-urgent call back, or ask for a work or school excuse for 24 hours related to this e-Visit. If it has been greater than 24 hours you will  need to follow up with your provider, or enter a new e-Visit to address those concerns. You will get an e-mail in the next two days asking about your experience.  I hope that your e-visit has been valuable and will speed your recovery. Thank you for using e-visits.   I have spent 5 minutes in review of e-visit questionnaire, review and updating patient chart, medical  decision making and response to patient.   Delon CHRISTELLA Dickinson, PA-C

## 2024-04-28 NOTE — Addendum Note (Signed)
 Addended by: VIVIENNE DELON HERO on: 04/28/2024 10:37 AM   Modules accepted: Orders

## 2024-05-09 MED ORDER — BENZONATATE 200 MG PO CAPS: 200.0000 mg | ORAL_CAPSULE | Freq: Three times a day (TID) | ORAL | 0 refills | Status: AC | PRN

## 2024-05-09 NOTE — Addendum Note (Signed)
 Addended by: Amantha Sklar on: 05/09/2024 09:28 AM   Modules accepted: Orders
